# Patient Record
Sex: Male | Born: 1965 | Race: White | Hispanic: No | Smoking: Never smoker
Health system: Southern US, Community
[De-identification: ages and names within clinical notes are randomized; demographics above are authoritative.]

## PROBLEM LIST (undated history)

## (undated) DIAGNOSIS — I10 Essential (primary) hypertension: Secondary | ICD-10-CM

## (undated) DIAGNOSIS — E785 Hyperlipidemia, unspecified: Secondary | ICD-10-CM

## (undated) DIAGNOSIS — F32A Depression, unspecified: Secondary | ICD-10-CM

## (undated) HISTORY — PX: TOTAL HIP ARTHROPLASTY: SHX124

---

## 2010-12-10 ENCOUNTER — Emergency Department (INDEPENDENT_AMBULATORY_CARE_PROVIDER_SITE_OTHER): Payer: Medicare Other

## 2010-12-10 ENCOUNTER — Emergency Department (HOSPITAL_BASED_OUTPATIENT_CLINIC_OR_DEPARTMENT_OTHER)
Admission: EM | Admit: 2010-12-10 | Discharge: 2010-12-10 | Disposition: A | Discharge status: Home / Self Care | Payer: Medicare Other | Attending: Emergency Medicine | Admitting: Emergency Medicine

## 2010-12-10 DIAGNOSIS — I10 Essential (primary) hypertension: Secondary | ICD-10-CM | POA: Insufficient documentation

## 2010-12-10 DIAGNOSIS — R079 Chest pain, unspecified: Secondary | ICD-10-CM

## 2010-12-10 DIAGNOSIS — R071 Chest pain on breathing: Secondary | ICD-10-CM | POA: Insufficient documentation

## 2010-12-10 DIAGNOSIS — S2249XA Multiple fractures of ribs, unspecified side, initial encounter for closed fracture: Secondary | ICD-10-CM | POA: Insufficient documentation

## 2010-12-10 DIAGNOSIS — M25539 Pain in unspecified wrist: Secondary | ICD-10-CM

## 2010-12-10 DIAGNOSIS — E7800 Pure hypercholesterolemia, unspecified: Secondary | ICD-10-CM | POA: Insufficient documentation

## 2010-12-10 LAB — BASIC METABOLIC PANEL
BUN: 18 mg/dL (ref 6–23)
CO2: 19 mEq/L (ref 19–32)
Calcium: 9.6 mg/dL (ref 8.4–10.5)
Chloride: 109 mEq/L (ref 96–112)
Creatinine, Ser: 1.1 mg/dL (ref 0.4–1.5)
GFR calc Af Amer: >60 mL/min (ref >60)
GFR calc non Af Amer: >60 mL/min (ref >60)
Glucose, Bld: 119 mg/dL — ABNORMAL HIGH (ref 70–99)
Potassium: 3.5 mEq/L (ref 3.5–5.1)
Sodium: 144 mEq/L (ref 135–145)

## 2010-12-10 MED ORDER — IOHEXOL 300 MG/ML  SOLN
80.0000 mL | Freq: Once | INTRAMUSCULAR | Status: AC | PRN
  Administered 2010-12-10: 80 mL via INTRAVENOUS

## 2012-06-20 DIAGNOSIS — G4733 Obstructive sleep apnea (adult) (pediatric): Secondary | ICD-10-CM | POA: Insufficient documentation
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
## 2013-11-26 DIAGNOSIS — I10 Essential (primary) hypertension: Secondary | ICD-10-CM | POA: Diagnosis present

## 2013-11-26 DIAGNOSIS — D696 Thrombocytopenia, unspecified: Secondary | ICD-10-CM | POA: Insufficient documentation

## 2014-08-13 DIAGNOSIS — G4700 Insomnia, unspecified: Secondary | ICD-10-CM | POA: Insufficient documentation

## 2014-08-13 DIAGNOSIS — D860 Sarcoidosis of lung: Secondary | ICD-10-CM | POA: Insufficient documentation

## 2015-03-23 DIAGNOSIS — F32A Depression, unspecified: Secondary | ICD-10-CM | POA: Diagnosis present

## 2023-03-02 ENCOUNTER — Other Ambulatory Visit: Payer: Self-pay

## 2023-03-02 ENCOUNTER — Encounter (HOSPITAL_COMMUNITY): Payer: Self-pay

## 2023-03-02 ENCOUNTER — Inpatient Hospital Stay (HOSPITAL_COMMUNITY)
Admission: EM | Admit: 2023-03-02 | Discharge: 2023-03-05 | DRG: 871 | Disposition: A | Discharge status: Home / Self Care | Payer: Medicare Other | Attending: Student | Admitting: Student

## 2023-03-02 ENCOUNTER — Emergency Department (HOSPITAL_COMMUNITY): Payer: Medicare Other

## 2023-03-02 DIAGNOSIS — E785 Hyperlipidemia, unspecified: Secondary | ICD-10-CM | POA: Diagnosis present

## 2023-03-02 DIAGNOSIS — E876 Hypokalemia: Secondary | ICD-10-CM | POA: Diagnosis not present

## 2023-03-02 DIAGNOSIS — G4700 Insomnia, unspecified: Secondary | ICD-10-CM | POA: Diagnosis present

## 2023-03-02 DIAGNOSIS — F419 Anxiety disorder, unspecified: Secondary | ICD-10-CM | POA: Diagnosis present

## 2023-03-02 DIAGNOSIS — E871 Hypo-osmolality and hyponatremia: Secondary | ICD-10-CM | POA: Diagnosis present

## 2023-03-02 DIAGNOSIS — I2489 Other forms of acute ischemic heart disease: Secondary | ICD-10-CM | POA: Diagnosis present

## 2023-03-02 DIAGNOSIS — R7989 Other specified abnormal findings of blood chemistry: Secondary | ICD-10-CM | POA: Diagnosis not present

## 2023-03-02 DIAGNOSIS — J189 Pneumonia, unspecified organism: Secondary | ICD-10-CM | POA: Insufficient documentation

## 2023-03-02 DIAGNOSIS — N179 Acute kidney failure, unspecified: Secondary | ICD-10-CM | POA: Diagnosis present

## 2023-03-02 DIAGNOSIS — D860 Sarcoidosis of lung: Secondary | ICD-10-CM | POA: Diagnosis present

## 2023-03-02 DIAGNOSIS — Z6837 Body mass index (BMI) 37.0-37.9, adult: Secondary | ICD-10-CM | POA: Diagnosis not present

## 2023-03-02 DIAGNOSIS — G4733 Obstructive sleep apnea (adult) (pediatric): Secondary | ICD-10-CM | POA: Diagnosis present

## 2023-03-02 DIAGNOSIS — R509 Fever, unspecified: Principal | ICD-10-CM

## 2023-03-02 DIAGNOSIS — I10 Essential (primary) hypertension: Secondary | ICD-10-CM | POA: Diagnosis present

## 2023-03-02 DIAGNOSIS — T3695XA Adverse effect of unspecified systemic antibiotic, initial encounter: Secondary | ICD-10-CM | POA: Diagnosis present

## 2023-03-02 DIAGNOSIS — Z1152 Encounter for screening for COVID-19: Secondary | ICD-10-CM | POA: Diagnosis not present

## 2023-03-02 DIAGNOSIS — R6520 Severe sepsis without septic shock: Secondary | ICD-10-CM | POA: Diagnosis present

## 2023-03-02 DIAGNOSIS — R197 Diarrhea, unspecified: Secondary | ICD-10-CM | POA: Diagnosis present

## 2023-03-02 DIAGNOSIS — F32A Depression, unspecified: Secondary | ICD-10-CM | POA: Diagnosis present

## 2023-03-02 DIAGNOSIS — A403 Sepsis due to Streptococcus pneumoniae: Secondary | ICD-10-CM | POA: Diagnosis present

## 2023-03-02 DIAGNOSIS — D696 Thrombocytopenia, unspecified: Secondary | ICD-10-CM | POA: Diagnosis present

## 2023-03-02 DIAGNOSIS — A419 Sepsis, unspecified organism: Secondary | ICD-10-CM | POA: Diagnosis not present

## 2023-03-02 DIAGNOSIS — Z96642 Presence of left artificial hip joint: Secondary | ICD-10-CM | POA: Diagnosis present

## 2023-03-02 HISTORY — DX: Depression, unspecified: F32.A

## 2023-03-02 HISTORY — DX: Hyperlipidemia, unspecified: E78.5

## 2023-03-02 HISTORY — DX: Essential (primary) hypertension: I10

## 2023-03-02 LAB — COMPREHENSIVE METABOLIC PANEL
ALT: 46 U/L — ABNORMAL HIGH (ref 0–44)
AST: 38 U/L (ref 15–41)
Albumin: 3.4 g/dL — ABNORMAL LOW (ref 3.5–5.0)
Alkaline Phosphatase: 49 U/L (ref 38–126)
Anion gap: 9 (ref 5–15)
BUN: 16 mg/dL (ref 6–20)
CO2: 25 mmol/L (ref 22–32)
Calcium: 8.7 mg/dL — ABNORMAL LOW (ref 8.9–10.3)
Chloride: 100 mmol/L (ref 98–111)
Creatinine, Ser: 1.39 mg/dL — ABNORMAL HIGH (ref 0.61–1.24)
GFR, Estimated: 59 mL/min — ABNORMAL LOW (ref >60)
Glucose, Bld: 113 mg/dL — ABNORMAL HIGH (ref 70–99)
Potassium: 3.7 mmol/L (ref 3.5–5.1)
Sodium: 134 mmol/L — ABNORMAL LOW (ref 135–145)
Total Bilirubin: 0.7 mg/dL (ref 0.3–1.2)
Total Protein: 6.7 g/dL (ref 6.5–8.1)

## 2023-03-02 LAB — RESP PANEL BY RT-PCR (RSV, FLU A&B, COVID)  RVPGX2
Influenza A by PCR: NEGATIVE
Influenza B by PCR: NEGATIVE
Resp Syncytial Virus by PCR: NEGATIVE
SARS Coronavirus 2 by RT PCR: NEGATIVE

## 2023-03-02 LAB — CBC WITH DIFFERENTIAL/PLATELET
Abs Immature Granulocytes: 0.02 10*3/uL (ref 0.00–0.07)
Basophils Absolute: 0 10*3/uL (ref 0.0–0.1)
Basophils Relative: 0 %
Eosinophils Absolute: 0.1 10*3/uL (ref 0.0–0.5)
Eosinophils Relative: 1 %
HCT: 41.8 % (ref 39.0–52.0)
Hemoglobin: 13.6 g/dL (ref 13.0–17.0)
Immature Granulocytes: 0 %
Lymphocytes Relative: 10 %
Lymphs Abs: 0.8 10*3/uL (ref 0.7–4.0)
MCH: 30.4 pg (ref 26.0–34.0)
MCHC: 32.5 g/dL (ref 30.0–36.0)
MCV: 93.3 fL (ref 80.0–100.0)
Monocytes Absolute: 0.9 10*3/uL (ref 0.1–1.0)
Monocytes Relative: 12 %
Neutro Abs: 5.8 10*3/uL (ref 1.7–7.7)
Neutrophils Relative %: 77 %
Platelets: 131 10*3/uL — ABNORMAL LOW (ref 150–400)
RBC: 4.48 MIL/uL (ref 4.22–5.81)
RDW: 13.5 % (ref 11.5–15.5)
WBC: 7.6 10*3/uL (ref 4.0–10.5)
nRBC: 0 % (ref 0.0–0.2)

## 2023-03-02 LAB — RESPIRATORY PANEL BY PCR

## 2023-03-02 LAB — PROTIME-INR
INR: 1.2 (ref 0.8–1.2)
Prothrombin Time: 15 seconds (ref 11.4–15.2)

## 2023-03-02 LAB — URINALYSIS, W/ REFLEX TO CULTURE (INFECTION SUSPECTED)
Bilirubin Urine: NEGATIVE
Glucose, UA: NEGATIVE mg/dL
Ketones, ur: NEGATIVE mg/dL
Leukocytes,Ua: NEGATIVE
Nitrite: NEGATIVE
Protein, ur: 30 mg/dL — AB
Specific Gravity, Urine: 1.023 (ref 1.005–1.030)
pH: 5 (ref 5.0–8.0)

## 2023-03-02 LAB — TROPONIN I (HIGH SENSITIVITY)
Troponin I (High Sensitivity): 22 ng/L — ABNORMAL HIGH (ref <18)
Troponin I (High Sensitivity): 23 ng/L — ABNORMAL HIGH (ref <18)

## 2023-03-02 LAB — BRAIN NATRIURETIC PEPTIDE: B Natriuretic Peptide: 43.2 pg/mL (ref 0.0–100.0)

## 2023-03-02 LAB — LACTIC ACID, PLASMA
Lactic Acid, Venous: 1.6 mmol/L (ref 0.5–1.9)
Lactic Acid, Venous: 1.1 mmol/L (ref 0.5–1.9)

## 2023-03-02 LAB — CK: Total CK: 123 U/L (ref 49–397)

## 2023-03-02 LAB — APTT: aPTT: 34 seconds (ref 24–36)

## 2023-03-02 MED ORDER — ONDANSETRON HCL 4 MG/2ML IJ SOLN: 4.0000 mg | Freq: Four times a day (QID) | INTRAMUSCULAR | Status: DC | PRN

## 2023-03-02 MED ORDER — ENOXAPARIN SODIUM 40 MG/0.4ML IJ SOSY
40.0000 mg | PREFILLED_SYRINGE | INTRAMUSCULAR | Status: DC
  Administered 2023-03-02 – 2023-03-04 (×3): 40 mg via SUBCUTANEOUS
  Filled 2023-03-02 (×3): qty 0.4

## 2023-03-02 MED ORDER — SODIUM CHLORIDE 0.9 % IV SOLN: 2.0000 g | Freq: Once | INTRAVENOUS | Status: DC

## 2023-03-02 MED ORDER — GUAIFENESIN ER 600 MG PO TB12
600.0000 mg | ORAL_TABLET | Freq: Two times a day (BID) | ORAL | Status: DC
  Administered 2023-03-03 – 2023-03-05 (×5): 600 mg via ORAL
  Filled 2023-03-02 (×6): qty 1

## 2023-03-02 MED ORDER — SODIUM CHLORIDE 0.9% FLUSH
3.0000 mL | Freq: Two times a day (BID) | INTRAVENOUS | Status: DC
  Administered 2023-03-02 – 2023-03-05 (×6): 3 mL via INTRAVENOUS

## 2023-03-02 MED ORDER — ACETAMINOPHEN 650 MG RE SUPP: 650.0000 mg | Freq: Four times a day (QID) | RECTAL | Status: DC | PRN

## 2023-03-02 MED ORDER — SODIUM CHLORIDE 0.9 % IV SOLN: 100.0000 mg | Freq: Once | INTRAVENOUS | Status: DC

## 2023-03-02 MED ORDER — MELATONIN 3 MG PO TABS
3.0000 mg | ORAL_TABLET | Freq: Every evening | ORAL | Status: DC | PRN
  Administered 2023-03-02: 3 mg via ORAL
  Filled 2023-03-02: qty 1

## 2023-03-02 MED ORDER — CITALOPRAM HYDROBROMIDE 40 MG PO TABS
40.0000 mg | ORAL_TABLET | Freq: Every day | ORAL | Status: DC
  Administered 2023-03-03 – 2023-03-05 (×3): 40 mg via ORAL
  Filled 2023-03-02 (×3): qty 1

## 2023-03-02 MED ORDER — SODIUM CHLORIDE 0.9 % IV SOLN
2.0000 g | INTRAVENOUS | Status: DC
  Administered 2023-03-03 – 2023-03-05 (×3): 2 g via INTRAVENOUS
  Filled 2023-03-02 (×3): qty 20

## 2023-03-02 MED ORDER — SODIUM CHLORIDE 0.9 % IV SOLN
500.0000 mg | INTRAVENOUS | Status: DC
  Administered 2023-03-02: 500 mg via INTRAVENOUS
  Filled 2023-03-02 (×2): qty 5

## 2023-03-02 MED ORDER — SODIUM CHLORIDE 0.9 % IV SOLN
1.0000 g | Freq: Once | INTRAVENOUS | Status: AC
  Administered 2023-03-02: 1 g via INTRAVENOUS
  Filled 2023-03-02: qty 10

## 2023-03-02 MED ORDER — SODIUM CHLORIDE 0.9 % IV BOLUS
1000.0000 mL | Freq: Once | INTRAVENOUS | Status: AC
  Administered 2023-03-02: 1000 mL via INTRAVENOUS

## 2023-03-02 MED ORDER — LACTATED RINGERS IV BOLUS
1000.0000 mL | Freq: Once | INTRAVENOUS | Status: AC
  Administered 2023-03-02: 1000 mL via INTRAVENOUS

## 2023-03-02 MED ORDER — ONDANSETRON HCL 4 MG PO TABS
4.0000 mg | ORAL_TABLET | Freq: Four times a day (QID) | ORAL | Status: DC | PRN
  Filled 2023-03-02: qty 1

## 2023-03-02 MED ORDER — SIMVASTATIN 20 MG PO TABS
40.0000 mg | ORAL_TABLET | Freq: Every evening | ORAL | Status: DC
  Administered 2023-03-02 – 2023-03-04 (×3): 40 mg via ORAL
  Filled 2023-03-02 (×3): qty 2

## 2023-03-02 MED ORDER — ACETAMINOPHEN 325 MG PO TABS
650.0000 mg | ORAL_TABLET | Freq: Once | ORAL | Status: AC
  Administered 2023-03-02: 650 mg via ORAL
  Filled 2023-03-02: qty 2

## 2023-03-02 MED ORDER — ACETAMINOPHEN 325 MG PO TABS
650.0000 mg | ORAL_TABLET | Freq: Four times a day (QID) | ORAL | Status: DC | PRN
  Administered 2023-03-02 – 2023-03-04 (×5): 650 mg via ORAL
  Filled 2023-03-02 (×5): qty 2

## 2023-03-02 MED ORDER — ALBUTEROL SULFATE (2.5 MG/3ML) 0.083% IN NEBU: 2.5000 mg | INHALATION_SOLUTION | RESPIRATORY_TRACT | Status: DC | PRN

## 2023-03-02 MED ORDER — LACTATED RINGERS IV SOLN: INTRAVENOUS | Status: DC

## 2023-03-02 MED ORDER — SENNOSIDES-DOCUSATE SODIUM 8.6-50 MG PO TABS: 1.0000 | ORAL_TABLET | Freq: Every evening | ORAL | Status: DC | PRN

## 2023-03-02 MED ORDER — IOHEXOL 350 MG/ML SOLN
75.0000 mL | Freq: Once | INTRAVENOUS | Status: AC | PRN
  Administered 2023-03-02: 75 mL via INTRAVENOUS

## 2023-03-02 NOTE — ED Triage Notes (Signed)
Pt came in via POV d/t shivers, diaphoresis & not sleeping well since he reports mowing the grass for a couple hours 5 days ago. He also had a few occasions of uncontrolled loose stools & started a fever yesterday & today in triage he is 103.2. A/Ox4, & reports 4/10 body aches felt in his shoulders.

## 2023-03-02 NOTE — H&P (Signed)
History and Physical    Louis Casey ZOX:[id number] DOB: [birth year] DOA: 03/02/2023  PCP: Adolph Pollack, FNP  Patient coming from: Home  I have personally briefly reviewed patient's old medical records in [facility]  Chief Complaint: Fever, shortness of breath  HPI: Louis Casey is a 57 y.o. male with medical history significant for HTN, HLD, depression who presented to the ED for evaluation of fever and shortness of breath.  Patient reports 5 days of  Fevers, chills, body aches, nonproductive cough, and progressive shortness of breath.  He has had some small-volume loose stools.  Appetite has been low.  He has not had any nausea, vomiting, abdominal pain.  He denies any tobacco use or sick contacts.  He went to urgent care today where he had negative influenza A/B testing.  He was sent to the ED for further evaluation.  ED Course  Labs/Imaging on admission: I have personally reviewed following labs and imaging studies.  Initial vitals showed BP 163/71, pulse 90, RR 28, temp 103.2 F, SpO2 95% on room air.  Labs show WBC 7.6, hemoglobin 13.6, platelets 131,000, sodium 134, potassium 3.7, bicarb 25, BUN 16, creatinine 1.39, serum glucose 113, AST 30, ALT 46, alk phos 49, total Aramine 0.7, CK1 23, troponin 22, lactic acid 1.6 > 1.1.  COVID, influenza, RSV PCR negative.  Blood cultures in process.  UA negative for UTI.  CT chest/abdomen/pelvis with contrast shows multifocal patchy airspace opacities in the bilateral lower lobes and minimally in the left upper lobe.  No acute localizing process in the abdomen or pelvis.  Moderate sized ventral hernia containing mesenteric fat noted.  SpO2 dropped to 90% with ambulation and patient appeared dyspneic.  Patient was given 1 L LR, 1 L normal saline, IV ceftriaxone and doxycycline.  The hospitalist service was consulted to admit for further evaluation and management.  Review of Systems: All systems reviewed and are negative except  as documented in history of present illness above.   Past Medical History:  Diagnosis Date   Depression    Hyperlipemia    Hypertension     Past Surgical History:  Procedure Laterality Date   TOTAL HIP ARTHROPLASTY Left     Social History:  reports that he has never smoked. He has never used smokeless tobacco. He reports that he does not drink alcohol and does not use drugs.  No Known Allergies  History reviewed. No pertinent family history.   Prior to Admission medications   Medication Sig Start Date End Date Taking? Authorizing Provider  acetaminophen (TYLENOL) 500 MG tablet Take 1,000 mg by mouth every 6 (six) hours as needed for moderate pain, fever or headache.   Yes [provider]  citalopram (CELEXA) 40 MG tablet Take 40 mg by mouth daily.   Yes [provider]  losartan (COZAAR) 100 MG tablet Take 100 mg by mouth every evening.   Yes [provider]  MELATONIN PO Take 3-6 tablets by mouth at bedtime as needed (sleep).   Yes [provider]  simvastatin (ZOCOR) 40 MG tablet Take 40 mg by mouth every evening.   Yes [provider]  triamcinolone cream (KENALOG) 0.1 % Apply 1 Application topically 2 (two) times daily as needed (rash, irritation).   Yes [provider]    Physical Exam: Vitals:   03/02/23 1600 03/02/23 1604 03/02/23 1725 03/02/23 1726  BP: (!) 147/82   (!) 151/89  Pulse: 79   81  Resp: (!) 27  18  Temp:  99.8 F (37.7 C) 99.8 F (37.7 C)   TempSrc:  Oral Oral   SpO2: 97%   98%  Weight:      Height:       Constitutional: Resting in bed, fatigued Eyes: EOMI, lids and conjunctivae normal ENMT: Mucous membranes are moist. Posterior pharynx clear of any exudate or lesions.Normal dentition.  Neck: normal, supple, no masses. Respiratory: Bibasilar inspiratory crackles. Normal respiratory effort while at rest. No accessory muscle use.  Frequent cough. Cardiovascular: Regular rate and rhythm, no  murmurs / rubs / gallops. No extremity edema. 2+ pedal pulses. Abdomen: no tenderness, no masses palpated.  Musculoskeletal: no clubbing / cyanosis. No joint deformity upper and lower extremities. Good ROM, no contractures. Normal muscle tone.  Skin: no rashes, lesions, ulcers. No induration Neurologic: Sensation intact. Strength 5/5 in all 4.  Psychiatric: Normal judgment and insight. Alert and oriented x 3. Normal mood.   EKG: Personally reviewed. Sinus rhythm, rate 90, no acute ischemic changes.  No prior for comparison.  Assessment/Plan Principal Problem:   Sepsis due to pneumonia [facility] Active Problems:   AKI (acute kidney injury) (HCC)   Elevated troponin   Hypertension   Hyperlipemia   Depression   Louis Casey is a 57 y.o. male with medical history significant for HTN, HLD, depression who is admitted with sepsis due to multifocal pneumonia.  Assessment and Plan: Sepsis due to multifocal pneumonia, POA: Presenting with fever, tachycardia.  CT imaging consistent with multifocal pneumonia.  COVID, flu, RSV negative.  SpO2 stable at rest, dropped to 90% with ambulation. -Continue IV ceftriaxone and azithromycin -Follow blood cultures, strep pneumonia urinary antigen -Supplemental O2 as needed -Check full respiratory pathogen panel -IS, FV, Mucinex  Acute kidney injury: Mild with creatinine 1.39 on admission compared to previous baseline 1.07 in the setting of sepsis.  Hold losartan and continue IV fluid hydration overnight.  Elevated troponin: Mild troponin elevation of 22.  Patient denies any chest pain.  Likely demand ischemia in setting of sepsis.  Repeat troponin pending.  EKG without acute ischemic changes.  Hypertension: Losartan on hold due to AKI.  Hyperlipidemia: Continue simvastatin.  Depression: Continue Celexa.   DVT prophylaxis: enoxaparin (LOVENOX) injection 40 mg Start: 03/02/23 1945 Code Status: Full code Family Communication: Discussed with patient,  he has discussed with family Disposition Plan: From home and likely discharge to home pending clinical progress Consults called: None Severity of Illness: The appropriate patient status for this patient is INPATIENT. Inpatient status is judged to be reasonable and necessary in order to provide the required intensity of service to ensure the patient's safety. The patient's presenting symptoms, physical exam findings, and initial radiographic and laboratory data in the context of their chronic comorbidities is felt to place them at high risk for further clinical deterioration. Furthermore, it is not anticipated that the patient will be medically stable for discharge from the hospital within 2 midnights of admission.   * I certify that at the point of admission it is my clinical judgment that the patient will require inpatient hospital care spanning beyond 2 midnights from the point of admission due to high intensity of service, high risk for further deterioration and high frequency of surveillance required.Darreld Mclean MD [facility]  If 7PM-7AM, please contact night-coverage [email or web address]  03/02/2023, 7:41 PM

## 2023-03-02 NOTE — ED Notes (Signed)
ED TO INPATIENT HANDOFF REPORT  ED Nurse Name and Phone #: Amil Amen [phone]  S Name/Age/Gender Louis Casey 57 y.o. male Room/Bed: 020C/020C  Code Status   Code Status: Full Code  Home/SNF/Other Home Patient oriented to: self, place, time, and situation Is this baseline? Yes   Triage Complete: Triage complete  Chief Complaint Sepsis due to pneumonia (HCC) [J18.9, A41.9]  Triage Note Pt came in via POV d/t shivers, diaphoresis & not sleeping well since he reports mowing the grass for a couple hours 5 days ago. He also had a few occasions of uncontrolled loose stools & started a fever yesterday & today in triage he is 103.2. A/Ox4, & reports 4/10 body aches felt in his shoulders.    Allergies No Known Allergies  Level of Care/Admitting Diagnosis ED Disposition     ED Disposition  Admit   Condition  --   Comment  Hospital Area: [facility] [[id number]]  Level of Care: Telemetry Medical [104]  May admit patient to [facility] or [facility] if equivalent level of care is available:: No  Covid Evaluation: Confirmed COVID Negative  Diagnosis: Sepsis due to pneumonia [facility]) [[id number]]  Admitting Physician: Charlsie Quest [[id number]]  Attending Physician: Charlsie Quest [[id number]]  Certification:: I certify this patient will need inpatient services for at least 2 midnights  Estimated Length of Stay: 2          B Medical/Surgery History Past Medical History:  Diagnosis Date   Depression    Hyperlipemia    Hypertension    Past Surgical History:  Procedure Laterality Date   TOTAL HIP ARTHROPLASTY Left      A IV Location/Drains/Wounds Patient Lines/Drains/Airways Status     Active Line/Drains/Airways     Name Placement date Placement time Site Days   Peripheral IV 03/02/23 20 G Anterior;Distal;Right;Upper Arm 03/02/23  1345  Arm  less than 1            Intake/Output Last 24 hours  Intake/Output Summary (Last 24 hours) at 03/02/2023  2155 Last data filed at 03/02/2023 1711 Gross per 24 hour  Intake 2105.09 ml  Output --  Net 2105.09 ml    Labs/Imaging Results for orders placed or performed during the hospital encounter of 03/02/23 (from the past 48 hour(s))  Lactic acid, plasma     Status: None   Collection Time: 03/02/23 12:41 PM  Result Value Ref Range   Lactic Acid, Venous 1.6 0.5 - 1.9 mmol/L    Comment: Performed at [facility], [address], [place], [place] [address]  Comprehensive metabolic panel     Status: Abnormal   Collection Time: 03/02/23 12:41 PM  Result Value Ref Range   Sodium 134 (L) 135 - 145 mmol/L   Potassium 3.7 3.5 - 5.1 mmol/L   Chloride 100 98 - 111 mmol/L   CO2 25 22 - 32 mmol/L   Glucose, Bld 113 (H) 70 - 99 mg/dL    Comment: Glucose reference range applies only to samples taken after fasting for at least 8 hours.   BUN 16 6 - 20 mg/dL   Creatinine, Ser 4.69 (H) 0.61 - 1.24 mg/dL   Calcium 8.7 (L) 8.9 - 10.3 mg/dL   Total Protein 6.7 6.5 - 8.1 g/dL   Albumin 3.4 (L) 3.5 - 5.0 g/dL   AST 38 15 - 41 U/L   ALT 46 (H) 0 - 44 U/L   Alkaline Phosphatase 49 38 - 126 U/L  Total Bilirubin 0.7 0.3 - 1.2 mg/dL   GFR, Estimated 59 (L) >60 mL/min    Comment: (NOTE) Calculated using the CKD-EPI Creatinine Equation (2021)    Anion gap 9 5 - 15    Comment: Performed at [facility], [address], [place], [place] [address]  CBC with Differential     Status: Abnormal   Collection Time: 03/02/23 12:41 PM  Result Value Ref Range   WBC 7.6 4.0 - 10.5 K/uL   RBC 4.48 4.22 - 5.81 MIL/uL   Hemoglobin 13.6 13.0 - 17.0 g/dL   HCT 60.4 54.0 - 98.1 %   MCV 93.3 80.0 - 100.0 fL   MCH 30.4 26.0 - 34.0 pg   MCHC 32.5 30.0 - 36.0 g/dL   RDW 19.1 47.8 - 29.5 %   Platelets 131 (L) 150 - 400 K/uL   nRBC 0.0 0.0 - 0.2 %   Neutrophils Relative % 77 %   Neutro Abs 5.8 1.7 - 7.7 K/uL   Lymphocytes Relative 10 %   Lymphs Abs 0.8 0.7 - 4.0 K/uL   Monocytes Relative 12 %   Monocytes  Absolute 0.9 0.1 - 1.0 K/uL   Eosinophils Relative 1 %   Eosinophils Absolute 0.1 0.0 - 0.5 K/uL   Basophils Relative 0 %   Basophils Absolute 0.0 0.0 - 0.1 K/uL   Immature Granulocytes 0 %   Abs Immature Granulocytes 0.02 0.00 - 0.07 K/uL    Comment: Performed at [facility], [address], [place], [place] [address]  CK     Status: None   Collection Time: 03/02/23 12:41 PM  Result Value Ref Range   Total CK 123 49 - 397 U/L    Comment: Performed at [facility], [address], [place], [place] [address]  Resp panel by RT-PCR (RSV, Flu A&B, Covid) Anterior Nasal Swab     Status: None   Collection Time: 03/02/23 12:50 PM   Specimen: Anterior Nasal Swab  Result Value Ref Range   SARS Coronavirus 2 by RT PCR NEGATIVE NEGATIVE   Influenza A by PCR NEGATIVE NEGATIVE   Influenza B by PCR NEGATIVE NEGATIVE    Comment: (NOTE) The Xpert Xpress SARS-CoV-2/FLU/RSV plus assay is intended as an aid in the diagnosis of influenza from Nasopharyngeal swab specimens and should not be used as a sole basis for treatment. Nasal washings and aspirates are unacceptable for Xpert Xpress SARS-CoV-2/FLU/RSV testing.  Fact Sheet for Patients: BloggerCourse.com  Fact Sheet for Healthcare Providers: SeriousBroker.it  This test is not yet approved or cleared by the [place] FDA and has been authorized for detection and/or diagnosis of SARS-CoV-2 by FDA under an Emergency Use Authorization (EUA). This EUA will remain in effect (meaning this test can be used) for the duration of the COVID-19 declaration under Section 564(b)(1) of the Act, 21 U.S.C. section 360bbb-3(b)(1), unless the authorization is terminated or revoked.     Resp Syncytial Virus by PCR NEGATIVE NEGATIVE    Comment: (NOTE) Fact Sheet for Patients: BloggerCourse.com  Fact Sheet for Healthcare  Providers: SeriousBroker.it  This test is not yet approved or cleared by the [place] FDA and has been authorized for detection and/or diagnosis of SARS-CoV-2 by FDA under an Emergency Use Authorization (EUA). This EUA will remain in effect (meaning this test can be used) for the duration of the COVID-19 declaration under Section 564(b)(1) of the Act, 21 U.S.C. section 360bbb-3(b)(1), unless the authorization is terminated or revoked.  Performed at [facility], [address] [place], [place] [address]   Urinalysis, w/ Reflex to Culture (Infection Suspected) -Urine, Clean Catch     Status: Abnormal   Collection Time: 03/02/23  3:15 PM  Result Value Ref Range   Specimen Source URINE, CLEAN CATCH    Color, Urine YELLOW YELLOW   APPearance CLEAR CLEAR   Specific Gravity, Urine 1.023 1.005 - 1.030   pH 5.0 5.0 - 8.0   Glucose, UA NEGATIVE NEGATIVE mg/dL   Hgb urine dipstick SMALL (A) NEGATIVE   Bilirubin Urine NEGATIVE NEGATIVE   Ketones, ur NEGATIVE NEGATIVE mg/dL   Protein, ur 30 (A) NEGATIVE mg/dL   Nitrite NEGATIVE NEGATIVE   Leukocytes,Ua NEGATIVE NEGATIVE   RBC / HPF 0-5 0 - 5 RBC/hpf   WBC, UA 0-5 0 - 5 WBC/hpf    Comment:        Reflex urine culture not performed if WBC <=10, OR if Squamous epithelial cells >5. If Squamous epithelial cells >5 suggest recollection.    Bacteria, UA RARE (A) NONE SEEN   Squamous Epithelial / HPF 0-5 0 - 5 /HPF   Mucus PRESENT     Comment: Performed at [facility], [address] [place], [place] [address]  Lactic acid, plasma     Status: None   Collection Time: 03/02/23  5:20 PM  Result Value Ref Range   Lactic Acid, Venous 1.1 0.5 - 1.9 mmol/L    Comment: Performed at [facility], [address], [place], [place] [address]  Protime-INR     Status: None   Collection Time: 03/02/23  5:20 PM  Result Value Ref Range   Prothrombin Time 15.0 11.4 - 15.2 seconds   INR 1.2 0.8 - 1.2     Comment: (NOTE) INR goal varies based on device and disease states. Performed at [facility], [address], [place], [place] [address]   APTT     Status: None   Collection Time: 03/02/23  5:20 PM  Result Value Ref Range   aPTT 34 24 - 36 seconds    Comment: Performed at [facility], [address], [place], [place] [address]  Troponin I (High Sensitivity)     Status: Abnormal   Collection Time: 03/02/23  5:20 PM  Result Value Ref Range   Troponin I (High Sensitivity) 22 (H) <18 ng/L    Comment: (NOTE) Elevated high sensitivity troponin I (hsTnI) values and significant  changes across serial measurements may suggest ACS but many other  chronic and acute conditions are known to elevate hsTnI results.  Refer to the "Links" section for chest pain algorithms and additional  guidance. Performed at [facility], [address], [place], [place] [address]   Respiratory (~20 pathogens) panel by PCR     Status: None   Collection Time: 03/02/23  7:23 PM   Specimen: Nasopharyngeal Swab; Respiratory  Result Value Ref Range   Adenovirus NOT DETECTED NOT DETECTED   Coronavirus 229E NOT DETECTED NOT DETECTED    Comment: (NOTE) The Coronavirus on the Respiratory Panel, DOES NOT test for the novel  Coronavirus (2019 nCoV)    Coronavirus HKU1 NOT DETECTED NOT DETECTED   Coronavirus NL63 NOT DETECTED NOT DETECTED   Coronavirus OC43 NOT DETECTED NOT DETECTED   Metapneumovirus NOT DETECTED NOT DETECTED   Rhinovirus / Enterovirus NOT DETECTED NOT DETECTED   Influenza A NOT DETECTED NOT DETECTED   Influenza B NOT  DETECTED NOT DETECTED   Parainfluenza Virus 1 NOT DETECTED NOT DETECTED   Parainfluenza Virus 2 NOT DETECTED NOT DETECTED   Parainfluenza Virus 3 NOT DETECTED NOT DETECTED   Parainfluenza Virus 4 NOT DETECTED NOT DETECTED   Respiratory Syncytial Virus NOT DETECTED NOT DETECTED   Bordetella pertussis NOT DETECTED NOT DETECTED   Bordetella Parapertussis NOT DETECTED  NOT DETECTED   Chlamydophila pneumoniae NOT DETECTED NOT DETECTED   Mycoplasma pneumoniae NOT DETECTED NOT DETECTED    Comment: Performed at [facility], [address], [place], [place] [address]  Troponin I (High Sensitivity)     Status: Abnormal   Collection Time: 03/02/23  7:46 PM  Result Value Ref Range   Troponin I (High Sensitivity) 23 (H) <18 ng/L    Comment: (NOTE) Elevated high sensitivity troponin I (hsTnI) values and significant  changes across serial measurements may suggest ACS but many other  chronic and acute conditions are known to elevate hsTnI results.  Refer to the "Links" section for chest pain algorithms and additional  guidance. Performed at [facility], [address] [place], [place] [address]   Brain natriuretic peptide     Status: None   Collection Time: 03/02/23  7:46 PM  Result Value Ref Range   B Natriuretic Peptide 43.2 0.0 - 100.0 pg/mL    Comment: Performed at [facility], [address] [place], [place] [address]   CT CHEST ABDOMEN PELVIS W CONTRAST  Result Date: 03/02/2023 CLINICAL DATA:  Sepsis.  Abdominal pain with diarrhea. EXAM: CT CHEST, ABDOMEN, AND PELVIS WITH CONTRAST TECHNIQUE: Multidetector CT imaging of the chest, abdomen and pelvis was performed following the standard protocol during bolus administration of intravenous contrast. RADIATION DOSE REDUCTION: This exam was performed according to the departmental dose-optimization program which includes automated exposure control, adjustment of the mA and/or kV according to patient size and/or use of iterative reconstruction technique. CONTRAST:  75mL OMNIPAQUE IOHEXOL 350 MG/ML SOLN COMPARISON:  CT of the chest 12/10/2010 FINDINGS: CT CHEST FINDINGS Cardiovascular: No significant vascular findings. Normal heart size. No pericardial effusion. Mediastinum/Nodes: No enlarged mediastinal, hilar, or axillary lymph nodes. Thyroid gland, trachea, and esophagus demonstrate no  significant findings. There is a small hiatal hernia. Lungs/Pleura: Multifocal patchy airspace opacities are seen in the bilateral lower lobes and minimally in the left upper lobe. There is no pleural effusion or pneumothorax. Trachea and central airways are patent. Musculoskeletal: No acute fractures are seen. There are healed left second, third and fourth rib fractures. CT ABDOMEN PELVIS FINDINGS Hepatobiliary: No focal liver abnormality is seen. Status post cholecystectomy. No biliary dilatation. Pancreas: Unremarkable. No pancreatic ductal dilatation or surrounding inflammatory changes. Spleen: Normal in size without focal abnormality. Adrenals/Urinary Tract: Adrenal glands are unremarkable. Kidneys are normal, without renal calculi, focal lesion, or hydronephrosis. Bladder is unremarkable. Stomach/Bowel: Stomach is within normal limits. Appendix appears normal. No evidence of bowel wall thickening, distention, or inflammatory changes. Vascular/Lymphatic: No significant vascular findings are present. No enlarged abdominal or pelvic lymph nodes. Reproductive: Prostate is unremarkable. Other: There is a anterior upper abdominal wall ventral hernia containing mesenteric fat, left of midline, moderate in size. Musculoskeletal: No acute osseous findings. Left-sided hip screw is present. There severe degenerative changes of the left hip. IMPRESSION: 1. Multifocal patchy airspace opacities in the bilateral lower lobes and minimally in the left upper lobe worrisome for multi focal pneumonia. 2. No acute localizing process in the abdomen or pelvis. 3. Moderate-sized ventral hernia containing mesenteric  fat. Electronically Signed   By: Darliss Cheney M.D.   On: 03/02/2023 17:36   DG Chest 2 View  Result Date: 03/02/2023 CLINICAL DATA:  Fever. EXAM: CHEST - 2 VIEW COMPARISON:  None Available. FINDINGS: The heart size and mediastinal contours are within normal limits. Bilateral perihilar opacities are noted concerning for  edema or atypical inflammation. The visualized skeletal structures are unremarkable. IMPRESSION: Bilateral perihilar opacities are noted concerning for edema or atypical inflammation. Follow-up radiographs are recommended. Electronically Signed   By: Lupita Raider M.D.   On: 03/02/2023 13:51    Pending Labs Unresulted Labs (From admission, onward)     Start     Ordered   03/03/23 0500  HIV Antibody (routine testing w rflx)  (HIV Antibody (Routine testing w reflex) panel)  Tomorrow morning,   R        03/02/23 1935   03/03/23 0500  Procalcitonin  Tomorrow morning,   R       References:    Procalcitonin Lower Respiratory Tract Infection AND Sepsis Procalcitonin Algorithm   03/02/23 1935   03/03/23 0500  CBC  Tomorrow morning,   R        03/02/23 1935   03/03/23 0500  Basic metabolic panel  Tomorrow morning,   R        03/02/23 1935   03/02/23 1935  Strep pneumoniae urinary antigen  Add-on,   AD        03/02/23 1935   03/02/23 1256  Blood Culture (routine x 2)  (Undifferentiated presentation (screening labs and basic nursing orders))  BLOOD CULTURE X 2,   STAT      03/02/23 1256            Vitals/Pain Today's Vitals   03/02/23 1941 03/02/23 2000 03/02/23 2020 03/02/23 2153  BP: (!) 160/77 (!) 178/83  (!) 168/75  Pulse: 93 98  90  Resp: 18 20  16   Temp: (!) 103.1 F (39.5 C)   99.2 F (37.3 C)  TempSrc: Oral   Oral  SpO2: 98% 95%  95%  Weight:      Height:      PainSc: 0-No pain  0-No pain 0-No pain    Isolation Precautions Droplet precaution  Medications Medications  enoxaparin (LOVENOX) injection 40 mg (40 mg Subcutaneous Given 03/02/23 1952)  sodium chloride flush (NS) 0.9 % injection 3 mL (3 mLs Intravenous Given 03/02/23 2132)  cefTRIAXone (ROCEPHIN) 2 g in sodium chloride 0.9 % 100 mL IVPB (has no administration in time range)  azithromycin (ZITHROMAX) 500 mg in sodium chloride 0.9 % 250 mL IVPB (0 mg Intravenous Stopped 03/02/23 2131)  acetaminophen (TYLENOL) tablet  650 mg (650 mg Oral Given 03/02/23 1944)    Or  acetaminophen (TYLENOL) suppository 650 mg ( Rectal See Alternative 03/02/23 1944)  ondansetron (ZOFRAN) tablet 4 mg (has no administration in time range)    Or  ondansetron (ZOFRAN) injection 4 mg (has no administration in time range)  senna-docusate (Senokot-S) tablet 1 tablet (has no administration in time range)  guaiFENesin (MUCINEX) 12 hr tablet 600 mg (600 mg Oral Not Given 03/02/23 2132)  albuterol (PROVENTIL) (2.5 MG/3ML) 0.083% nebulizer solution 2.5 mg (has no administration in time range)  lactated ringers infusion ( Intravenous New Bag/Given 03/02/23 1958)  citalopram (CELEXA) tablet 40 mg (has no administration in time range)  melatonin tablet 3 mg (has no administration in time range)  simvastatin (ZOCOR) tablet 40 mg (40 mg Oral Given 03/02/23 2132)  acetaminophen (TYLENOL) tablet 650 mg (650 mg Oral Given 03/02/23 1410)  cefTRIAXone (ROCEPHIN) 1 g in sodium chloride 0.9 % 100 mL IVPB (0 g Intravenous Stopped 03/02/23 1554)  sodium chloride 0.9 % bolus 1,000 mL (0 mLs Intravenous Stopped 03/02/23 1554)  lactated ringers bolus 1,000 mL (0 mLs Intravenous Stopped 03/02/23 1711)  iohexol (OMNIPAQUE) 350 MG/ML injection 75 mL (75 mLs Intravenous Contrast Given 03/02/23 1649)    Mobility walks     Focused Assessments Pulmonary Assessment Handoff:  Lung sounds: Bilateral Breath Sounds: Clear L Breath Sounds: Clear R Breath Sounds: Clear O2 Device: Room Air      R Recommendations: See Admitting Provider Note  Report given to:   Additional Notes: A+O x 4, uses urinal,

## 2023-03-02 NOTE — Plan of Care (Signed)
  Problem: Fluid Volume: Goal: Hemodynamic stability will improve Outcome: Progressing   Problem: Education: Goal: Knowledge of General Education information will improve Description: Including pain rating scale, medication(s)/side effects and non-pharmacologic comfort measures Outcome: Progressing   

## 2023-03-02 NOTE — ED Notes (Signed)
Ambulated pt with portable pulse ox. Pt dropped down to 90% and was dyspneic. Pt states he is not usually this short of breath. MD made aware.

## 2023-03-02 NOTE — ED Provider Notes (Signed)
Medical Decision Making: Care of patient assumed from [place], PA-C  at 3:49 PM.  Agree with history.  See their note for further details.  Briefly, 57 y.o. male with PMH/PSH as below.  Past Medical History:  Diagnosis Date   Depression    Hyperlipemia    Hypertension    Past Surgical History:  Procedure Laterality Date   TOTAL HIP ARTHROPLASTY Left       Patient presents with fever without a known source.  Is having some chest tightness and has haziness on chest x-ray.  No known tick exposure.  No headaches/neck stiffness low concern for meningitis.  Currently pending labs and CT chest abdomen and pelvis.  Current plan is as follows: Follow-up workup and reassess  MDM:  -Reviewed and confirmed nursing documentation for past medical history, family history, social history. -Vital signs stable. -Upon reevaluation, patient resting comfortably in bed, hemodynamically stable, no distress. -CT chest abdomen pelvis is notable for pneumonia.  I have ambulated the patient and during ambulation patient desaturates from 95% to 89 to 90% on room air.  He is severely dyspneic and has difficulty walking due to his shortness of breath.  Given this we will proceed with IV antibiotics and admission.  Labs otherwise my interpretation show mild creatinine bump from baseline 1.0-1.39 today.  Mild hyponatremia and thrombocytopenia.  CK is normal.  COVID and flu negative.  UA without UTI.  Lactic acid normal.  Troponin is mildly elevated to 22 with repeat 23.  Delta troponin of 1.  EKG on my interpretation without STEMI or ischemic equivalent.  Low suspicion for ACS at this time.  I discussed the patient with medicine who will admit the patient. -Patient had no acute events while under my care in the emergency department.     The plan for this patient was discussed with Dr. Wilkie Aye, who voiced agreement and who oversaw evaluation and treatment of this patient.  Marta Lamas, MD Emergency  Medicine, PGY-3  Note: Dragon medical dictation software was used in the creation of this note.      Chase Caller, MD 03/02/23 2119    Rozelle Logan, DO 03/02/23 2338

## 2023-03-02 NOTE — ED Notes (Signed)
Pt reports that he neglected to mention that he has had a headache for 5 days. He attributes this to insomnia.

## 2023-03-02 NOTE — ED Notes (Signed)
Lupita Leash is pt's wife [phone]. Would like to be updated with plan.

## 2023-03-02 NOTE — Hospital Course (Signed)
Louis Casey is a 57 y.o. male with medical history significant for HTN, HLD, depression who is admitted with sepsis due to multifocal pneumonia.

## 2023-03-02 NOTE — ED Provider Triage Note (Signed)
Emergency Medicine Provider Triage Evaluation Note  Louis Casey , a 57 y.o. male  was evaluated in triage.  Pt complains of feeling sick, chills onset Saturday after mowing the ground, laid in bed. Woke up and went to dinner. Has been in bed with sweats for the past few days. Home COVID negative. Yesterday tried gummies to try and help with sleep since he was having trouble sleeping (desperate, doesn't normally take gummies, took 1). Went to UC today who checked a urine and told there was something with his urine and digestive system and told to go to the ER.  Fever 101 at home, taking zinc, Advil, immune vitamin.  Review of Systems  Positive: SHOB, worse with exertion, fever, urine dark orange (since Saturday, decreased volume), occasional cough, diarrhea, nausea Negative: Vomiting, abdominal pain  Physical Exam  BP (!) 163/71 (BP Location: Right Arm)   Pulse 90   Temp (!) 103.2 F (39.6 C)   Resp (!) 28   Ht 6\' 1"  (1.854 m)   Wt 126.6 kg   SpO2 95%   BMI 36.81 kg/m  Gen:   Awake, no distress   Resp:  Tachypenic,. Right lower lung crackles  MSK:   Moves extremities without difficulty  Other:  Abd soft and non tender   Medical Decision Making  Medically screening exam initiated at 12:47 PM.  Appropriate orders placed.  Louis Casey was informed that the remainder of the evaluation will be completed by another provider, this initial triage assessment does not replace that evaluation, and the importance of remaining in the ED until their evaluation is complete.     Louis Fend, PA-C 03/02/23 1255

## 2023-03-02 NOTE — ED Provider Notes (Signed)
[place] EMERGENCY DEPARTMENT AT [facility] Provider Note   CSN: [id number] Arrival date & time: 03/02/23  1201     History  Chief Complaint  Patient presents with   Fever   Generalized Body Aches   Loose Stools    Louis Casey is a 57 y.o. male.  Patient presents emergency department today for evaluation of febrile illness.  No significant past medical history.  No chronic medications.  Patient states that he mowed his yard on 02/25/2023.  Upon coming back inside he noted that he was feeling very tired and generally unwell.  He reports multiple vague symptoms including some chest discomfort without significant cough or shortness of breath.  He has had diarrhea that has been watery without blood.  No significant abdominal pain or back pain.  He does report urinary frequency at baseline.  No rectal pain but states that he has been having "trouble" urinating at times noting that his urine is dark in color.  He has spent most of his time over the past several days sleeping.  Patient's family member administered a home COVID test which was negative.  He has tried over-the-counter medications including THC gummies which he got from a neighbor.  He did go to an urgent care today and had some testing done was told that he may have something wrong with his urine or in his abdomen and was referred to the ED.  He cannot give further details.  No known sick contacts.  No recent travels.  No tick bites.  Tmax at home was 101 F.       Home Medications Prior to Admission medications   Not on File      Allergies    Patient has no allergy information on record.    Review of Systems   Review of Systems  Physical Exam Updated Vital Signs BP (!) 176/84   Pulse 88   Temp (!) 103.2 F (39.6 C)   Resp (!) 23   Ht 6\' 1"  (1.854 m)   Wt 126.6 kg   SpO2 100%   BMI 36.81 kg/m   Physical Exam Vitals and nursing note reviewed.  Constitutional:      General: He is not in acute  distress.    Appearance: He is well-developed.  HENT:     Head: Normocephalic and atraumatic.     Right Ear: Tympanic membrane, ear canal and external ear normal.     Left Ear: Tympanic membrane, ear canal and external ear normal.     Nose: Nose normal.     Mouth/Throat:     Mouth: Mucous membranes are moist.  Eyes:     General:        Right eye: No discharge.        Left eye: No discharge.     Conjunctiva/sclera: Conjunctivae normal.  Cardiovascular:     Rate and Rhythm: Normal rate and regular rhythm.     Heart sounds: Normal heart sounds.  Pulmonary:     Effort: Pulmonary effort is normal.     Breath sounds: Normal breath sounds.  Abdominal:     Palpations: Abdomen is soft.     Tenderness: There is no abdominal tenderness. There is no right CVA tenderness, left CVA tenderness, guarding or rebound.  Musculoskeletal:     Cervical back: Normal range of motion and neck supple.     Right lower leg: No edema.     Left lower leg: No edema.  Skin:  General: Skin is warm and dry.  Neurological:     Mental Status: He is alert.     ED Results / Procedures / Treatments   Labs (all labs ordered are listed, but only abnormal results are displayed) Labs Reviewed  COMPREHENSIVE METABOLIC PANEL - Abnormal; Notable for the following components:      Result Value   Sodium 134 (*)    Glucose, Bld 113 (*)    Creatinine, Ser 1.39 (*)    Calcium 8.7 (*)    Albumin 3.4 (*)    ALT 46 (*)    GFR, Estimated 59 (*)    All other components within normal limits  CBC WITH DIFFERENTIAL/PLATELET - Abnormal; Notable for the following components:   Platelets 131 (*)    All other components within normal limits  RESP PANEL BY RT-PCR (RSV, FLU A&B, COVID)  RVPGX2  CULTURE, BLOOD (ROUTINE X 2)  CULTURE, BLOOD (ROUTINE X 2)  LACTIC ACID, PLASMA  CK  LACTIC ACID, PLASMA  PROTIME-INR  APTT  URINALYSIS, W/ REFLEX TO CULTURE (INFECTION SUSPECTED)  TROPONIN I (HIGH SENSITIVITY)  TROPONIN I  (HIGH SENSITIVITY)    EKG EKG Interpretation  Date/Time:  Thursday March 02 2023 12:59:24 EDT Ventricular Rate:  90 PR Interval:  136 QRS Duration: 84 QT Interval:  362 QTC Calculation: 442 R Axis:   -39 Text Interpretation: Normal sinus rhythm Left axis deviation Abnormal ECG No old tracing to compare Confirmed by Melene Plan [phone]) on 03/02/2023 1:37:33 PM  Radiology DG Chest 2 View  Result Date: 03/02/2023 CLINICAL DATA:  Fever. EXAM: CHEST - 2 VIEW COMPARISON:  None Available. FINDINGS: The heart size and mediastinal contours are within normal limits. Bilateral perihilar opacities are noted concerning for edema or atypical inflammation. The visualized skeletal structures are unremarkable. IMPRESSION: Bilateral perihilar opacities are noted concerning for edema or atypical inflammation. Follow-up radiographs are recommended. Electronically Signed   By: Lupita Raider M.D.   On: 03/02/2023 13:51    Procedures Procedures    Medications Ordered in ED Medications  lactated ringers bolus 1,000 mL (has no administration in time range)  acetaminophen (TYLENOL) tablet 650 mg (650 mg Oral Given 03/02/23 1410)  cefTRIAXone (ROCEPHIN) 1 g in sodium chloride 0.9 % 100 mL IVPB (1 g Intravenous New Bag/Given 03/02/23 1443)  sodium chloride 0.9 % bolus 1,000 mL (1,000 mLs Intravenous New Bag/Given 03/02/23 1413)    ED Course/ Medical Decision Making/ A&P    Patient seen and examined. History obtained directly from patient.   Labs/EKG: Sepsis workup was ordered in triage including APTT and PT/INR to evaluate for signs of endorgan damage related to sepsis.  I have added troponin due to patient's vague chest symptoms.  EKG personally reviewed and interpreted as above.  Imaging: Ordered chest x-ray with questionable infiltrates..  Medications/Fluids: Ordered: IV fluid bolus.   Most recent vital signs reviewed and are as follows: BP (!) 176/84   Pulse 88   Temp (!) 103.2 F (39.6 C)   Resp (!)  23   Ht 6\' 1"  (1.854 m)   Wt 126.6 kg   SpO2 100%   BMI 36.81 kg/m   Initial impression: Febrile illness, unclear cause.  Patient's chest x-ray does appear to be potentially abnormal.  Also considered UTI source such as prostatitis or urinary tract infection.  3:37 PM Signout to ED resident Dr. Ericka Pontiff at shift change.   Reassessment performed. Patient appears stable.   Labs personally reviewed and interpreted including: CBC with  normal white blood cell count and hemoglobin, slightly low platelets at 131; CMP with creatinine of 1.39, BUN of 16, otherwise unremarkable; lactate normal at 1.6; CK normal at 23.  Imaging personally visualized and interpreted including: Chest x-ray, agree hazy perihilar opacities noted.  Reviewed pertinent lab work and imaging with patient at bedside. Questions answered.   Most current vital signs reviewed and are as follows: BP (!) 151/37   Pulse 92   Temp (!) 103.2 F (39.6 C)   Resp (!) 30   Ht 6\' 1"  (1.854 m)   Wt 126.6 kg   SpO2 97%   BMI 36.81 kg/m   Plan: Will obtain imaging of the chest, abdomen and pelvis given vague symptoms to further evaluate for possibility pneumonia, intra-abdominal infection.  Awaiting UA as well.  Patient looks well, nontoxic overall.  No respiratory distress although mild tachypnea noted.                                Medical Decision Making Amount and/or Complexity of Data Reviewed Labs: ordered. Radiology: ordered.   Pending completion of work-up.         Final Clinical Impression(s) / ED Diagnoses Final diagnoses:  Febrile illness    Rx / DC Orders ED Discharge Orders     None         Renne Crigler, PA-C 03/02/23 1540    Melene Plan, DO 03/02/23 1540

## 2023-03-02 NOTE — ED Notes (Signed)
Pt got SOB while ambulating across the hall to his room after using the bathroom. 97% on RA when back on the monitor.

## 2023-03-03 DIAGNOSIS — F419 Anxiety disorder, unspecified: Secondary | ICD-10-CM | POA: Diagnosis not present

## 2023-03-03 DIAGNOSIS — A419 Sepsis, unspecified organism: Secondary | ICD-10-CM | POA: Diagnosis not present

## 2023-03-03 DIAGNOSIS — F32A Depression, unspecified: Secondary | ICD-10-CM

## 2023-03-03 DIAGNOSIS — I10 Essential (primary) hypertension: Secondary | ICD-10-CM

## 2023-03-03 DIAGNOSIS — R7989 Other specified abnormal findings of blood chemistry: Secondary | ICD-10-CM | POA: Diagnosis not present

## 2023-03-03 DIAGNOSIS — N179 Acute kidney failure, unspecified: Secondary | ICD-10-CM

## 2023-03-03 DIAGNOSIS — J189 Pneumonia, unspecified organism: Secondary | ICD-10-CM

## 2023-03-03 DIAGNOSIS — R6520 Severe sepsis without septic shock: Secondary | ICD-10-CM

## 2023-03-03 LAB — CBC
HCT: 38.3 % — ABNORMAL LOW (ref 39.0–52.0)
Hemoglobin: 13 g/dL (ref 13.0–17.0)
MCH: 31.3 pg (ref 26.0–34.0)
MCHC: 33.9 g/dL (ref 30.0–36.0)
MCV: 92.3 fL (ref 80.0–100.0)
Platelets: 113 10*3/uL — ABNORMAL LOW (ref 150–400)
RBC: 4.15 MIL/uL — ABNORMAL LOW (ref 4.22–5.81)
RDW: 13.7 % (ref 11.5–15.5)
WBC: 7.5 10*3/uL (ref 4.0–10.5)
nRBC: 0 % (ref 0.0–0.2)

## 2023-03-03 LAB — BASIC METABOLIC PANEL
Anion gap: 10 (ref 5–15)
BUN: 11 mg/dL (ref 6–20)
CO2: 22 mmol/L (ref 22–32)
Calcium: 8.3 mg/dL — ABNORMAL LOW (ref 8.9–10.3)
Chloride: 102 mmol/L (ref 98–111)
Creatinine, Ser: 1 mg/dL (ref 0.61–1.24)
GFR, Estimated: >60 mL/min (ref >60)
Glucose, Bld: 156 mg/dL — ABNORMAL HIGH (ref 70–99)
Potassium: 2.9 mmol/L — ABNORMAL LOW (ref 3.5–5.1)
Sodium: 134 mmol/L — ABNORMAL LOW (ref 135–145)

## 2023-03-03 LAB — MAGNESIUM: Magnesium: 2 mg/dL (ref 1.7–2.4)

## 2023-03-03 LAB — STREP PNEUMONIAE URINARY ANTIGEN: Strep Pneumo Urinary Antigen: NEGATIVE

## 2023-03-03 LAB — HIV ANTIBODY (ROUTINE TESTING W REFLEX): HIV Screen 4th Generation wRfx: NONREACTIVE

## 2023-03-03 LAB — HEMOGLOBIN A1C
Hgb A1c MFr Bld: 5.3 % (ref 4.8–5.6)
Mean Plasma Glucose: 105.41 mg/dL

## 2023-03-03 LAB — PROCALCITONIN: Procalcitonin: 0.19 ng/mL

## 2023-03-03 MED ORDER — AZITHROMYCIN 250 MG PO TABS
500.0000 mg | ORAL_TABLET | ORAL | Status: DC
  Administered 2023-03-03 – 2023-03-04 (×2): 500 mg via ORAL
  Filled 2023-03-03 (×2): qty 2

## 2023-03-03 MED ORDER — ZOLPIDEM TARTRATE 5 MG PO TABS: 5.0000 mg | ORAL_TABLET | Freq: Every evening | ORAL | Status: DC | PRN

## 2023-03-03 MED ORDER — MELATONIN 3 MG PO TABS: 6.0000 mg | ORAL_TABLET | Freq: Every evening | ORAL | Status: DC | PRN

## 2023-03-03 MED ORDER — LOSARTAN POTASSIUM 50 MG PO TABS
100.0000 mg | ORAL_TABLET | Freq: Every evening | ORAL | Status: DC
  Administered 2023-03-03 – 2023-03-04 (×2): 100 mg via ORAL
  Filled 2023-03-03 (×2): qty 2

## 2023-03-03 MED ORDER — POTASSIUM CHLORIDE CRYS ER 20 MEQ PO TBCR
40.0000 meq | EXTENDED_RELEASE_TABLET | ORAL | Status: AC
  Administered 2023-03-03 (×3): 40 meq via ORAL
  Filled 2023-03-03 (×3): qty 2

## 2023-03-03 MED ORDER — ZOLPIDEM TARTRATE 5 MG PO TABS
10.0000 mg | ORAL_TABLET | Freq: Every evening | ORAL | Status: DC | PRN
  Administered 2023-03-03 – 2023-03-04 (×2): 10 mg via ORAL
  Filled 2023-03-03 (×2): qty 2

## 2023-03-03 NOTE — Progress Notes (Signed)
PROGRESS NOTE  Louis Casey WUJ:[id number] DOB: 01-05-1966   PCP: Adolph Pollack, FNP  Patient is from: Home.  Lives with his wife.  Independently ambulates at baseline.  DOA: 03/02/2023 LOS: 1  Chief complaints Chief Complaint  Patient presents with   Fever   Generalized Body Aches   Loose Stools     Brief Narrative / Interim history: 57 year old M with PMH of HTN, HLD, depression, OSA and obesity presenting with fever, chills, myalgia, dry cough and progressive shortness of breath for 5 days and found to have multifocal pneumonia with CTA chest showing bilateral lower lobe and LUL patchy infiltrate, and admitted for severe sepsis due to multifocal pneumonia.  He was febrile 203.2 and tachypneic to upper 20s.  Also AKI with Cr 1.4.  COVID-19, influenza and full RVP nonreactive.  Blood cultures obtained.  Started on IV fluid, CTX and Zithromax, and admitted.    Subjective: Seen and examined earlier this morning.  No major events overnight of this morning.  Shortness of breath improved but still with significant DOE, fatigue and intermittent dry cough.  Some headache with cough.  Also lack of sleep despite melatonin.  Objective: Vitals:   03/02/23 2153 03/02/23 2328 03/03/23 0441 03/03/23 0810  BP: (!) 168/75 (!) 163/86 (!) 165/87 (!) 155/76  Pulse: 90 82 87 83  Resp: 16 18 18  (!) 21  Temp: 99.2 F (37.3 C) 99.5 F (37.5 C) 98.7 F (37.1 C) 99.8 F (37.7 C)  TempSrc: Oral Oral Oral Oral  SpO2: 95% 97% 92% 95%  Weight:  127.8 kg    Height:        Examination:  GENERAL: No apparent distress.  Nontoxic. HEENT: MMM.  Vision and hearing grossly intact.  NECK: Supple.  No apparent JVD.  RESP:  No IWOB.  Fair aeration bilaterally. CVS:  RRR. Heart sounds normal.  ABD/GI/GU: BS+. Abd soft, NTND.  MSK/EXT:  Moves extremities. No apparent deformity. No edema.  SKIN: no apparent skin lesion or wound NEURO: Awake, alert and oriented appropriately.  No apparent focal neuro  deficit. PSYCH: Calm. Normal affect.   Procedures:  None  Microbiology summarized: COVID-19, influenza and RSV PCR nonreactive Full RVP nonreactive  Assessment and plan: Principal Problem:   Severe sepsis (HCC) Active Problems:   AKI (acute kidney injury) (HCC)   Elevated troponin   Hypertension, benign   Hyperlipemia   Anxiety and depression   Multifocal pneumonia   Morbid obesity (HCC)  Severe sepsis due to multifocal pneumonia: POA.  Febrile and tachypneic with AKI on presentation.  Symptomatic for 5 days.  No leukocytosis.  COVID-19, influenza, RSV and full RVP panel nonreactive.  Blood cultures pending.  No oxygen requirement. -Continue CTX and Zithromax -Continue IV fluid for AKI -Continue changes from atria, flutter valve and antitussive  Acute kidney injury: Baseline Cr about 1.0.  AKI resolved. Recent Labs    03/02/23 1241 03/03/23 0828  BUN 16 11  CREATININE 1.39* 1.00  -Continue IV fluid hydration   Elevated troponin: Likely demand ischemia.  No significant delta to suggest ACS.  No chest pain either.  EKG without acute ischemic finding.   Hypertension: BP slightly elevated. -Resume home losartan   Hyperlipidemia: -Continue simvastatin.   Anxiety/depression/insomnia: -Continue Celexa. -Increase melatonin -Add Ambien  OSA on CPAP -CPAP at night  Hypokalemia -Monitor replenish as appropriate  Thrombocytopenia: Mild.  Chronic. -Continue monitoring  Morbid obesity: Elevated BMI with comorbidity as above. Body mass index is 37.17 kg/m. -Encourage lifestyle change to  lose weight           DVT prophylaxis:  enoxaparin (LOVENOX) injection 40 mg Start: 03/02/23 1945  Code Status: Full code Family Communication: None at bedside Level of care: Med-Surg Status is: Inpatient Remains inpatient appropriate because: Severe sepsis due to multifocal pneumonia and hypokalemia   Final disposition: Home Consultants:  None  55 minutes with more  than 50% spent in reviewing records, counseling patient/family and coordinating care.   Sch Meds:  Scheduled Meds:  citalopram  40 mg Oral Daily   enoxaparin (LOVENOX) injection  40 mg Subcutaneous Q24H   guaiFENesin  600 mg Oral BID   losartan  100 mg Oral QPM   potassium chloride  40 mEq Oral Q4H   simvastatin  40 mg Oral QPM   sodium chloride flush  3 mL Intravenous Q12H   Continuous Infusions:  azithromycin Stopped (03/02/23 2056)   cefTRIAXone (ROCEPHIN)  IV 2 g (03/03/23 1014)   PRN Meds:.acetaminophen **OR** acetaminophen, albuterol, melatonin, ondansetron **OR** ondansetron (ZOFRAN) IV, senna-docusate, zolpidem  Antimicrobials: Anti-infectives (From admission, onward)    Start     Dose/Rate Route Frequency Ordered Stop   03/03/23 1000  cefTRIAXone (ROCEPHIN) 2 g in sodium chloride 0.9 % 100 mL IVPB        2 g 200 mL/hr over 30 Minutes Intravenous Every 24 hours 03/02/23 1935 03/08/23 0959   03/02/23 1945  azithromycin (ZITHROMAX) 500 mg in sodium chloride 0.9 % 250 mL IVPB        500 mg 250 mL/hr over 60 Minutes Intravenous Every 24 hours 03/02/23 1935 03/07/23 1944   03/02/23 1845  cefTRIAXone (ROCEPHIN) 2 g in sodium chloride 0.9 % 100 mL IVPB  Status:  Discontinued        2 g 200 mL/hr over 30 Minutes Intravenous  Once 03/02/23 1839 03/02/23 1935   03/02/23 1845  doxycycline (VIBRAMYCIN) 100 mg in sodium chloride 0.9 % 250 mL IVPB  Status:  Discontinued        100 mg 125 mL/hr over 120 Minutes Intravenous  Once 03/02/23 1839 03/02/23 1935   03/02/23 1300  cefTRIAXone (ROCEPHIN) 1 g in sodium chloride 0.9 % 100 mL IVPB        1 g 200 mL/hr over 30 Minutes Intravenous  Once 03/02/23 1256 03/02/23 1554        I have personally reviewed the following labs and images: CBC: Recent Labs  Lab 03/02/23 1241 03/03/23 0828  WBC 7.6 7.5  NEUTROABS 5.8  --   HGB 13.6 13.0  HCT 41.8 38.3*  MCV 93.3 92.3  PLT 131* 113*   BMP &GFR Recent Labs  Lab 03/02/23 1241  03/03/23 0828  NA 134* 134*  K 3.7 2.9*  CL 100 102  CO2 25 22  GLUCOSE 113* 156*  BUN 16 11  CREATININE 1.39* 1.00  CALCIUM 8.7* 8.3*   Estimated Creatinine Clearance: 112.9 mL/min (by C-G formula based on SCr of 1 mg/dL). Liver & Pancreas: Recent Labs  Lab 03/02/23 1241  AST 38  ALT 46*  ALKPHOS 49  BILITOT 0.7  PROT 6.7  ALBUMIN 3.4*   No results for input(s): "LIPASE", "AMYLASE" in the last 168 hours. No results for input(s): "AMMONIA" in the last 168 hours. Diabetic: No results for input(s): "HGBA1C" in the last 72 hours. No results for input(s): "GLUCAP" in the last 168 hours. Cardiac Enzymes: Recent Labs  Lab 03/02/23 1241  CKTOTAL 123   No results for input(s): "PROBNP" in the  last 8760 hours. Coagulation Profile: Recent Labs  Lab 03/02/23 1720  INR 1.2   Thyroid Function Tests: No results for input(s): "TSH", "T4TOTAL", "FREET4", "T3FREE", "THYROIDAB" in the last 72 hours. Lipid Profile: No results for input(s): "CHOL", "HDL", "LDLCALC", "TRIG", "CHOLHDL", "LDLDIRECT" in the last 72 hours. Anemia Panel: No results for input(s): "VITAMINB12", "FOLATE", "FERRITIN", "TIBC", "IRON", "RETICCTPCT" in the last 72 hours. Urine analysis:    Component Value Date/Time   COLORURINE YELLOW 03/02/2023 1515   APPEARANCEUR CLEAR 03/02/2023 1515   LABSPEC 1.023 03/02/2023 1515   PHURINE 5.0 03/02/2023 1515   GLUCOSEU NEGATIVE 03/02/2023 1515   HGBUR SMALL (A) 03/02/2023 1515   BILIRUBINUR NEGATIVE 03/02/2023 1515   KETONESUR NEGATIVE 03/02/2023 1515   PROTEINUR 30 (A) 03/02/2023 1515   NITRITE NEGATIVE 03/02/2023 1515   LEUKOCYTESUR NEGATIVE 03/02/2023 1515   Sepsis Labs: Invalid input(s): "PROCALCITONIN", "LACTICIDVEN"  Microbiology: Recent Results (from the past 240 hour(s))  Resp panel by RT-PCR (RSV, Flu A&B, Covid) Anterior Nasal Swab     Status: None   Collection Time: 03/02/23 12:50 PM   Specimen: Anterior Nasal Swab  Result Value Ref Range Status    SARS Coronavirus 2 by RT PCR NEGATIVE NEGATIVE Final   Influenza A by PCR NEGATIVE NEGATIVE Final   Influenza B by PCR NEGATIVE NEGATIVE Final    Comment: (NOTE) The Xpert Xpress SARS-CoV-2/FLU/RSV plus assay is intended as an aid in the diagnosis of influenza from Nasopharyngeal swab specimens and should not be used as a sole basis for treatment. Nasal washings and aspirates are unacceptable for Xpert Xpress SARS-CoV-2/FLU/RSV testing.  Fact Sheet for Patients: BloggerCourse.com  Fact Sheet for Healthcare Providers: SeriousBroker.it  This test is not yet approved or cleared by the [place] FDA and has been authorized for detection and/or diagnosis of SARS-CoV-2 by FDA under an Emergency Use Authorization (EUA). This EUA will remain in effect (meaning this test can be used) for the duration of the COVID-19 declaration under Section 564(b)(1) of the Act, 21 U.S.C. section 360bbb-3(b)(1), unless the authorization is terminated or revoked.     Resp Syncytial Virus by PCR NEGATIVE NEGATIVE Final    Comment: (NOTE) Fact Sheet for Patients: BloggerCourse.com  Fact Sheet for Healthcare Providers: SeriousBroker.it  This test is not yet approved or cleared by the [place] FDA and has been authorized for detection and/or diagnosis of SARS-CoV-2 by FDA under an Emergency Use Authorization (EUA). This EUA will remain in effect (meaning this test can be used) for the duration of the COVID-19 declaration under Section 564(b)(1) of the Act, 21 U.S.C. section 360bbb-3(b)(1), unless the authorization is terminated or revoked.  Performed at [facility], [address] [place], [place] [address]   Respiratory (~20 pathogens) panel by PCR     Status: None   Collection Time: 03/02/23  7:23 PM   Specimen: Nasopharyngeal Swab; Respiratory  Result Value Ref Range Status    Adenovirus NOT DETECTED NOT DETECTED Final   Coronavirus 229E NOT DETECTED NOT DETECTED Final    Comment: (NOTE) The Coronavirus on the Respiratory Panel, DOES NOT test for the novel  Coronavirus (2019 nCoV)    Coronavirus HKU1 NOT DETECTED NOT DETECTED Final   Coronavirus NL63 NOT DETECTED NOT DETECTED Final   Coronavirus OC43 NOT DETECTED NOT DETECTED Final   Metapneumovirus NOT DETECTED NOT DETECTED Final   Rhinovirus / Enterovirus NOT DETECTED NOT DETECTED Final   Influenza A NOT DETECTED NOT DETECTED Final   Influenza B NOT DETECTED  NOT DETECTED Final   Parainfluenza Virus 1 NOT DETECTED NOT DETECTED Final   Parainfluenza Virus 2 NOT DETECTED NOT DETECTED Final   Parainfluenza Virus 3 NOT DETECTED NOT DETECTED Final   Parainfluenza Virus 4 NOT DETECTED NOT DETECTED Final   Respiratory Syncytial Virus NOT DETECTED NOT DETECTED Final   Bordetella pertussis NOT DETECTED NOT DETECTED Final   Bordetella Parapertussis NOT DETECTED NOT DETECTED Final   Chlamydophila pneumoniae NOT DETECTED NOT DETECTED Final   Mycoplasma pneumoniae NOT DETECTED NOT DETECTED Final    Comment: Performed at [facility], [address], [place], [place] [address]    Radiology Studies: CT CHEST ABDOMEN PELVIS W CONTRAST  Result Date: 03/02/2023 CLINICAL DATA:  Sepsis.  Abdominal pain with diarrhea. EXAM: CT CHEST, ABDOMEN, AND PELVIS WITH CONTRAST TECHNIQUE: Multidetector CT imaging of the chest, abdomen and pelvis was performed following the standard protocol during bolus administration of intravenous contrast. RADIATION DOSE REDUCTION: This exam was performed according to the departmental dose-optimization program which includes automated exposure control, adjustment of the mA and/or kV according to patient size and/or use of iterative reconstruction technique. CONTRAST:  75mL OMNIPAQUE IOHEXOL 350 MG/ML SOLN COMPARISON:  CT of the chest 12/10/2010 FINDINGS: CT CHEST FINDINGS Cardiovascular: No  significant vascular findings. Normal heart size. No pericardial effusion. Mediastinum/Nodes: No enlarged mediastinal, hilar, or axillary lymph nodes. Thyroid gland, trachea, and esophagus demonstrate no significant findings. There is a small hiatal hernia. Lungs/Pleura: Multifocal patchy airspace opacities are seen in the bilateral lower lobes and minimally in the left upper lobe. There is no pleural effusion or pneumothorax. Trachea and central airways are patent. Musculoskeletal: No acute fractures are seen. There are healed left second, third and fourth rib fractures. CT ABDOMEN PELVIS FINDINGS Hepatobiliary: No focal liver abnormality is seen. Status post cholecystectomy. No biliary dilatation. Pancreas: Unremarkable. No pancreatic ductal dilatation or surrounding inflammatory changes. Spleen: Normal in size without focal abnormality. Adrenals/Urinary Tract: Adrenal glands are unremarkable. Kidneys are normal, without renal calculi, focal lesion, or hydronephrosis. Bladder is unremarkable. Stomach/Bowel: Stomach is within normal limits. Appendix appears normal. No evidence of bowel wall thickening, distention, or inflammatory changes. Vascular/Lymphatic: No significant vascular findings are present. No enlarged abdominal or pelvic lymph nodes. Reproductive: Prostate is unremarkable. Other: There is a anterior upper abdominal wall ventral hernia containing mesenteric fat, left of midline, moderate in size. Musculoskeletal: No acute osseous findings. Left-sided hip screw is present. There severe degenerative changes of the left hip. IMPRESSION: 1. Multifocal patchy airspace opacities in the bilateral lower lobes and minimally in the left upper lobe worrisome for multi focal pneumonia. 2. No acute localizing process in the abdomen or pelvis. 3. Moderate-sized ventral hernia containing mesenteric fat. Electronically Signed   By: Darliss Cheney M.D.   On: 03/02/2023 17:36   DG Chest 2 View  Result Date:  03/02/2023 CLINICAL DATA:  Fever. EXAM: CHEST - 2 VIEW COMPARISON:  None Available. FINDINGS: The heart size and mediastinal contours are within normal limits. Bilateral perihilar opacities are noted concerning for edema or atypical inflammation. The visualized skeletal structures are unremarkable. IMPRESSION: Bilateral perihilar opacities are noted concerning for edema or atypical inflammation. Follow-up radiographs are recommended. Electronically Signed   By: Lupita Raider M.D.   On: 03/02/2023 13:51      Ferol Laiche T. Cyril Woodmansee [facility]  If 7PM-7AM, please contact night-coverage [email or web address] 03/03/2023, 11:46 AM

## 2023-03-03 NOTE — Progress Notes (Signed)
   03/03/23 1705  Assess: MEWS Score  Temp (!) 101.6 F (38.7 C)  BP (!) 165/92  MAP (mmHg) 113  Pulse Rate 82  SpO2 98 %  O2 Device Room Air  Assess: MEWS Score  MEWS Temp 2  MEWS Systolic 0  MEWS Pulse 0  MEWS RR 0  MEWS LOC 0  MEWS Score 2  MEWS Score Color Yellow  Assess: if the MEWS score is Yellow or Red  Were vital signs taken at a resting state? Yes  Focused Assessment No change from prior assessment  Does the patient meet 2 or more of the SIRS criteria? No  MEWS guidelines implemented  Yes, yellow  Treat  MEWS Interventions Considered administering scheduled or prn medications/treatments as ordered  Take Vital Signs  Increase Vital Sign Frequency  Yellow: Q2hr x1, continue Q4hrs until patient remains green for 12hrs  Escalate  MEWS: Escalate Yellow: Discuss with charge nurse and consider notifying provider and/or RRT  Notify: Charge Nurse/RN  Name of Charge Nurse/RN Notified Raquel RN  Assess: SIRS CRITERIA  SIRS Temperature  1  SIRS Pulse 0  SIRS Respirations  0  SIRS WBC 0  SIRS Score Sum  1

## 2023-03-03 NOTE — Plan of Care (Signed)

## 2023-03-04 DIAGNOSIS — A419 Sepsis, unspecified organism: Secondary | ICD-10-CM | POA: Diagnosis not present

## 2023-03-04 DIAGNOSIS — N179 Acute kidney failure, unspecified: Secondary | ICD-10-CM | POA: Diagnosis not present

## 2023-03-04 DIAGNOSIS — F419 Anxiety disorder, unspecified: Secondary | ICD-10-CM | POA: Diagnosis not present

## 2023-03-04 DIAGNOSIS — R7989 Other specified abnormal findings of blood chemistry: Secondary | ICD-10-CM | POA: Diagnosis not present

## 2023-03-04 LAB — CULTURE, BLOOD (ROUTINE X 2)

## 2023-03-04 LAB — RENAL FUNCTION PANEL
Albumin: 2.9 g/dL — ABNORMAL LOW (ref 3.5–5.0)
Anion gap: 12 (ref 5–15)
BUN: 9 mg/dL (ref 6–20)
CO2: 21 mmol/L — ABNORMAL LOW (ref 22–32)
Calcium: 8.8 mg/dL — ABNORMAL LOW (ref 8.9–10.3)
Chloride: 105 mmol/L (ref 98–111)
Creatinine, Ser: 0.92 mg/dL (ref 0.61–1.24)
GFR, Estimated: >60 mL/min (ref >60)
Glucose, Bld: 109 mg/dL — ABNORMAL HIGH (ref 70–99)
Phosphorus: 2.8 mg/dL (ref 2.5–4.6)
Potassium: 4.3 mmol/L (ref 3.5–5.1)
Sodium: 138 mmol/L (ref 135–145)

## 2023-03-04 LAB — CBC
HCT: 39.6 % (ref 39.0–52.0)
Hemoglobin: 13.8 g/dL (ref 13.0–17.0)
MCH: 31.7 pg (ref 26.0–34.0)
MCHC: 34.8 g/dL (ref 30.0–36.0)
MCV: 91 fL (ref 80.0–100.0)
Platelets: 96 10*3/uL — ABNORMAL LOW (ref 150–400)
RBC: 4.35 MIL/uL (ref 4.22–5.81)
RDW: 13.4 % (ref 11.5–15.5)
WBC: 6.4 10*3/uL (ref 4.0–10.5)
nRBC: 0 % (ref 0.0–0.2)

## 2023-03-04 LAB — MAGNESIUM: Magnesium: 2.1 mg/dL (ref 1.7–2.4)

## 2023-03-04 LAB — PROCALCITONIN: Procalcitonin: 0.18 ng/mL

## 2023-03-04 MED ORDER — KETOROLAC TROMETHAMINE 15 MG/ML IJ SOLN
15.0000 mg | Freq: Once | INTRAMUSCULAR | Status: AC
  Administered 2023-03-04: 15 mg via INTRAVENOUS
  Filled 2023-03-04: qty 1

## 2023-03-04 MED ORDER — BUTALBITAL-APAP-CAFFEINE 50-325-40 MG PO TABS
1.0000 | ORAL_TABLET | Freq: Four times a day (QID) | ORAL | Status: DC | PRN
  Administered 2023-03-04: 1 via ORAL
  Filled 2023-03-04: qty 1

## 2023-03-04 MED ORDER — DEXAMETHASONE SODIUM PHOSPHATE 10 MG/ML IJ SOLN
10.0000 mg | Freq: Once | INTRAMUSCULAR | Status: AC
  Administered 2023-03-04: 10 mg via INTRAVENOUS
  Filled 2023-03-04: qty 1

## 2023-03-04 MED ORDER — LABETALOL HCL 5 MG/ML IV SOLN
10.0000 mg | INTRAVENOUS | Status: DC | PRN
  Administered 2023-03-04: 10 mg via INTRAVENOUS
  Filled 2023-03-04: qty 4

## 2023-03-04 MED ORDER — LOPERAMIDE HCL 2 MG PO CAPS: 2.0000 mg | ORAL_CAPSULE | ORAL | Status: DC | PRN

## 2023-03-04 NOTE — Progress Notes (Signed)
Patient 02 Saturations while sitting 95% on room air.   Patient ambulated one lap around the hallway maintaining an 02 Saturation of 94-95% on room air.  Patient tolerated ambulating well and denies any shortness of breath or lightheadedness

## 2023-03-04 NOTE — Progress Notes (Signed)
PROGRESS NOTE  Louis Casey WGN:[id number] DOB: 28-Mar-1966   PCP: Adolph Pollack, FNP  Patient is from: Home.  Lives with his wife.  Independently ambulates at baseline.  DOA: 03/02/2023 LOS: 2  Chief complaints Chief Complaint  Patient presents with   Fever   Generalized Body Aches   Loose Stools     Brief Narrative / Interim history: 57 year old M with PMH of HTN, HLD, depression, OSA and obesity presenting with fever, chills, myalgia, dry cough and progressive shortness of breath for 5 days and found to have multifocal pneumonia with CTA chest showing bilateral lower lobe and LUL patchy infiltrate, and admitted for severe sepsis due to multifocal pneumonia.  He was febrile 203.2 and tachypneic to upper 20s.  Also AKI with Cr 1.4.  COVID-19, influenza and full RVP nonreactive.  Blood cultures obtained.  Started on IV fluid, CTX and Zithromax, and admitted.  Slowly improving.  Fever curve downtrending.    Subjective: Seen and examined earlier this morning.  No major events overnight of this morning.  Reports some diarrhea overnight.  Breathing has improved.  Looks sleepy this morning.  Says he did not have good sleep last night despite Ambien and melatonin.  Did not tolerate CPAP mask   Objective: Vitals:   03/03/23 2025 03/03/23 2337 03/04/23 0400 03/04/23 0740  BP:  (!) 153/95 (!) 158/86 (!) 158/96  Pulse:  82 76 69  Resp:  18 20 17   Temp: 99.3 F (37.4 C) (!) 100.4 F (38 C) 99.6 F (37.6 C) 98.4 F (36.9 C)  TempSrc: Oral Oral Oral Oral  SpO2:  95% 96% 96%  Weight:      Height:        Examination: GENERAL: No apparent distress.  Nontoxic. HEENT: MMM.  Vision and hearing grossly intact.  NECK: Supple.  No apparent JVD.  RESP:  No IWOB.  Fair aeration bilaterally. CVS:  RRR. Heart sounds normal.  ABD/GI/GU: BS+. Abd soft, NTND.  MSK/EXT:   No apparent deformity. Moves extremities. No edema.  SKIN: no apparent skin lesion or wound NEURO: Sleepy but wakes  to voice.  Oriented appropriately.  No apparent focal neuro deficit. PSYCH: Calm. Normal affect.   Procedures:  None  Microbiology summarized: COVID-19, influenza and RSV PCR nonreactive Full RVP nonreactive  Assessment and plan: Principal Problem:   Severe sepsis (HCC) Active Problems:   AKI (acute kidney injury) (HCC)   Elevated troponin   Hypertension, benign   Hyperlipemia   Anxiety and depression   Multifocal pneumonia   Morbid obesity (HCC)  Severe sepsis due to multifocal pneumonia: POA.  Febrile and tachypneic with AKI POA.  Symptomatic for 5 days.  No leukocytosis.  COVID-19, influenza, RSV and full RVP panel nonreactive.  Blood cultures NGTD.  No oxygen requirement.  Fever curve downtrending.  Clinically improving -Continue CTX and Zithromax -Discontinue IV fluid -Continue incentive telemetry, flutter valve and antitussive  Acute kidney injury: Baseline Cr about 1.0.  AKI resolved. Recent Labs    03/02/23 1241 03/03/23 0828 03/04/23 0016  BUN 16 11 9   CREATININE 1.39* 1.00 0.92  -Discontinue IV fluid   Elevated troponin: Likely demand ischemia.  No significant delta to suggest ACS.  No chest pain either.  EKG without acute ischemic finding.   Hypertension: BP slightly elevated. -Discontinue IV fluid -Continue home losartan   Hyperlipidemia: -Continue simvastatin.   Anxiety/depression/insomnia: -Continue Celexa, melatonin and Ambien.  OSA on CPAP -CPAP at night-not tolerating CPAP mask.  Hypokalemia -Monitor replenish as  appropriate  Thrombocytopenia: Mild.  Acute on chronic. -Continue monitoring  Diarrhea: Due to antibiotics?  Gastroenteritis?  Abdominal exam benign -Start Imodium  Morbid obesity: Elevated BMI with comorbidity as above. Body mass index is 37.17 kg/m. -Encourage lifestyle change to lose weight           DVT prophylaxis:  enoxaparin (LOVENOX) injection 40 mg Start: 03/02/23 1945  Code Status: Full code Family  Communication: None at bedside Level of care: Med-Surg Status is: Inpatient Remains inpatient appropriate because: Severe sepsis due to multifocal pneumonia and fever   Final disposition: Home Consultants:  None  55 minutes with more than 50% spent in reviewing records, counseling patient/family and coordinating care.   Sch Meds:  Scheduled Meds:  azithromycin  500 mg Oral Q24H   citalopram  40 mg Oral Daily   enoxaparin (LOVENOX) injection  40 mg Subcutaneous Q24H   guaiFENesin  600 mg Oral BID   losartan  100 mg Oral QPM   simvastatin  40 mg Oral QPM   sodium chloride flush  3 mL Intravenous Q12H   Continuous Infusions:  cefTRIAXone (ROCEPHIN)  IV 2 g (03/04/23 1057)   PRN Meds:.acetaminophen **OR** acetaminophen, albuterol, loperamide, melatonin, ondansetron **OR** ondansetron (ZOFRAN) IV, senna-docusate, zolpidem  Antimicrobials: Anti-infectives (From admission, onward)    Start     Dose/Rate Route Frequency Ordered Stop   03/03/23 1800  azithromycin (ZITHROMAX) tablet 500 mg        500 mg Oral Every 24 hours 03/03/23 1212 03/07/23 1759   03/03/23 1000  cefTRIAXone (ROCEPHIN) 2 g in sodium chloride 0.9 % 100 mL IVPB        2 g 200 mL/hr over 30 Minutes Intravenous Every 24 hours 03/02/23 1935 03/08/23 0959   03/02/23 1945  azithromycin (ZITHROMAX) 500 mg in sodium chloride 0.9 % 250 mL IVPB  Status:  Discontinued        500 mg 250 mL/hr over 60 Minutes Intravenous Every 24 hours 03/02/23 1935 03/03/23 1212   03/02/23 1845  cefTRIAXone (ROCEPHIN) 2 g in sodium chloride 0.9 % 100 mL IVPB  Status:  Discontinued        2 g 200 mL/hr over 30 Minutes Intravenous  Once 03/02/23 1839 03/02/23 1935   03/02/23 1845  doxycycline (VIBRAMYCIN) 100 mg in sodium chloride 0.9 % 250 mL IVPB  Status:  Discontinued        100 mg 125 mL/hr over 120 Minutes Intravenous  Once 03/02/23 1839 03/02/23 1935   03/02/23 1300  cefTRIAXone (ROCEPHIN) 1 g in sodium chloride 0.9 % 100 mL IVPB         1 g 200 mL/hr over 30 Minutes Intravenous  Once 03/02/23 1256 03/02/23 1554        I have personally reviewed the following labs and images: CBC: Recent Labs  Lab 03/02/23 1241 03/03/23 0828 03/04/23 0016  WBC 7.6 7.5 6.4  NEUTROABS 5.8  --   --   HGB 13.6 13.0 13.8  HCT 41.8 38.3* 39.6  MCV 93.3 92.3 91.0  PLT 131* 113* 96*   BMP &GFR Recent Labs  Lab 03/02/23 1241 03/03/23 0828 03/04/23 0016  NA 134* 134* 138  K 3.7 2.9* 4.3  CL 100 102 105  CO2 25 22 21*  GLUCOSE 113* 156* 109*  BUN 16 11 9   CREATININE 1.39* 1.00 0.92  CALCIUM 8.7* 8.3* 8.8*  MG  --  2.0 2.1  PHOS  --   --  2.8   Estimated Creatinine  Clearance: 122.7 mL/min (by C-G formula based on SCr of 0.92 mg/dL). Liver & Pancreas: Recent Labs  Lab 03/02/23 1241 03/04/23 0016  AST 38  --   ALT 46*  --   ALKPHOS 49  --   BILITOT 0.7  --   PROT 6.7  --   ALBUMIN 3.4* 2.9*   No results for input(s): "LIPASE", "AMYLASE" in the last 168 hours. No results for input(s): "AMMONIA" in the last 168 hours. Diabetic: Recent Labs    03/03/23 0828  HGBA1C 5.3   No results for input(s): "GLUCAP" in the last 168 hours. Cardiac Enzymes: Recent Labs  Lab 03/02/23 1241  CKTOTAL 123   No results for input(s): "PROBNP" in the last 8760 hours. Coagulation Profile: Recent Labs  Lab 03/02/23 1720  INR 1.2   Thyroid Function Tests: No results for input(s): "TSH", "T4TOTAL", "FREET4", "T3FREE", "THYROIDAB" in the last 72 hours. Lipid Profile: No results for input(s): "CHOL", "HDL", "LDLCALC", "TRIG", "CHOLHDL", "LDLDIRECT" in the last 72 hours. Anemia Panel: No results for input(s): "VITAMINB12", "FOLATE", "FERRITIN", "TIBC", "IRON", "RETICCTPCT" in the last 72 hours. Urine analysis:    Component Value Date/Time   COLORURINE YELLOW 03/02/2023 1515   APPEARANCEUR CLEAR 03/02/2023 1515   LABSPEC 1.023 03/02/2023 1515   PHURINE 5.0 03/02/2023 1515   GLUCOSEU NEGATIVE 03/02/2023 1515   HGBUR SMALL  (A) 03/02/2023 1515   BILIRUBINUR NEGATIVE 03/02/2023 1515   KETONESUR NEGATIVE 03/02/2023 1515   PROTEINUR 30 (A) 03/02/2023 1515   NITRITE NEGATIVE 03/02/2023 1515   LEUKOCYTESUR NEGATIVE 03/02/2023 1515   Sepsis Labs: Invalid input(s): "PROCALCITONIN", "LACTICIDVEN"  Microbiology: Recent Results (from the past 240 hour(s))  Resp panel by RT-PCR (RSV, Flu A&B, Covid) Anterior Nasal Swab     Status: None   Collection Time: 03/02/23 12:50 PM   Specimen: Anterior Nasal Swab  Result Value Ref Range Status   SARS Coronavirus 2 by RT PCR NEGATIVE NEGATIVE Final   Influenza A by PCR NEGATIVE NEGATIVE Final   Influenza B by PCR NEGATIVE NEGATIVE Final    Comment: (NOTE) The Xpert Xpress SARS-CoV-2/FLU/RSV plus assay is intended as an aid in the diagnosis of influenza from Nasopharyngeal swab specimens and should not be used as a sole basis for treatment. Nasal washings and aspirates are unacceptable for Xpert Xpress SARS-CoV-2/FLU/RSV testing.  Fact Sheet for Patients: BloggerCourse.com  Fact Sheet for Healthcare Providers: SeriousBroker.it  This test is not yet approved or cleared by the [place] FDA and has been authorized for detection and/or diagnosis of SARS-CoV-2 by FDA under an Emergency Use Authorization (EUA). This EUA will remain in effect (meaning this test can be used) for the duration of the COVID-19 declaration under Section 564(b)(1) of the Act, 21 U.S.C. section 360bbb-3(b)(1), unless the authorization is terminated or revoked.     Resp Syncytial Virus by PCR NEGATIVE NEGATIVE Final    Comment: (NOTE) Fact Sheet for Patients: BloggerCourse.com  Fact Sheet for Healthcare Providers: SeriousBroker.it  This test is not yet approved or cleared by the [place] FDA and has been authorized for detection and/or diagnosis of SARS-CoV-2 by FDA under an  Emergency Use Authorization (EUA). This EUA will remain in effect (meaning this test can be used) for the duration of the COVID-19 declaration under Section 564(b)(1) of the Act, 21 U.S.C. section 360bbb-3(b)(1), unless the authorization is terminated or revoked.  Performed at [facility], [address], [place], [place] [address]   Blood Culture (routine x 2)  Status: None (Preliminary result)   Collection Time: 03/02/23  2:00 PM   Specimen: BLOOD  Result Value Ref Range Status   Specimen Description BLOOD RIGHT ANTECUBITAL  Final   Special Requests   Final    BOTTLES DRAWN AEROBIC AND ANAEROBIC Blood Culture results may not be optimal due to an inadequate volume of blood received in culture bottles   Culture   Final    NO GROWTH 2 DAYS Performed at [facility], [address] [place], [place] [address]    Report Status PENDING  Incomplete  Blood Culture (routine x 2)     Status: None (Preliminary result)   Collection Time: 03/02/23  2:30 PM   Specimen: BLOOD  Result Value Ref Range Status   Specimen Description BLOOD RIGHT ANTECUBITAL  Final   Special Requests   Final    BOTTLES DRAWN AEROBIC AND ANAEROBIC Blood Culture adequate volume   Culture   Final    NO GROWTH 2 DAYS Performed at [facility], [address], [place], [place] [address]    Report Status PENDING  Incomplete  Respiratory (~20 pathogens) panel by PCR     Status: None   Collection Time: 03/02/23  7:23 PM   Specimen: Nasopharyngeal Swab; Respiratory  Result Value Ref Range Status   Adenovirus NOT DETECTED NOT DETECTED Final   Coronavirus 229E NOT DETECTED NOT DETECTED Final    Comment: (NOTE) The Coronavirus on the Respiratory Panel, DOES NOT test for the novel  Coronavirus (2019 nCoV)    Coronavirus HKU1 NOT DETECTED NOT DETECTED Final   Coronavirus NL63 NOT DETECTED NOT DETECTED Final   Coronavirus OC43 NOT DETECTED NOT DETECTED Final   Metapneumovirus NOT DETECTED NOT  DETECTED Final   Rhinovirus / Enterovirus NOT DETECTED NOT DETECTED Final   Influenza A NOT DETECTED NOT DETECTED Final   Influenza B NOT DETECTED NOT DETECTED Final   Parainfluenza Virus 1 NOT DETECTED NOT DETECTED Final   Parainfluenza Virus 2 NOT DETECTED NOT DETECTED Final   Parainfluenza Virus 3 NOT DETECTED NOT DETECTED Final   Parainfluenza Virus 4 NOT DETECTED NOT DETECTED Final   Respiratory Syncytial Virus NOT DETECTED NOT DETECTED Final   Bordetella pertussis NOT DETECTED NOT DETECTED Final   Bordetella Parapertussis NOT DETECTED NOT DETECTED Final   Chlamydophila pneumoniae NOT DETECTED NOT DETECTED Final   Mycoplasma pneumoniae NOT DETECTED NOT DETECTED Final    Comment: Performed at [facility], [address] [place], [place] [address]    Radiology Studies: No results found.    Kadrian Partch T. Goth[facility]  If 7PM-7AM, please contact night-coverage [email or web address] 03/04/2023, 11:38 AM

## 2023-03-04 NOTE — Progress Notes (Signed)
   03/04/23 2316  BiPAP/CPAP/SIPAP  BiPAP/CPAP/SIPAP Pt Type Adult  BiPAP/CPAP/SIPAP DREAMSTATIOND  Mask Type Full face mask  Mask Size Medium  Respiratory Rate 16 breaths/min  FiO2 (%) 21 %  Patient Home Equipment No  Auto Titrate Yes  BiPAP/CPAP /SiPAP Vitals  Pulse Rate 68  Resp 16  SpO2 97 %

## 2023-03-05 DIAGNOSIS — J189 Pneumonia, unspecified organism: Secondary | ICD-10-CM | POA: Diagnosis not present

## 2023-03-05 DIAGNOSIS — A419 Sepsis, unspecified organism: Secondary | ICD-10-CM | POA: Diagnosis not present

## 2023-03-05 DIAGNOSIS — E785 Hyperlipidemia, unspecified: Secondary | ICD-10-CM

## 2023-03-05 DIAGNOSIS — I10 Essential (primary) hypertension: Secondary | ICD-10-CM | POA: Diagnosis not present

## 2023-03-05 LAB — RENAL FUNCTION PANEL
Albumin: 2.9 g/dL — ABNORMAL LOW (ref 3.5–5.0)
Anion gap: 10 (ref 5–15)
BUN: 15 mg/dL (ref 6–20)
CO2: 23 mmol/L (ref 22–32)
Calcium: 9.2 mg/dL (ref 8.9–10.3)
Chloride: 104 mmol/L (ref 98–111)
Creatinine, Ser: 0.82 mg/dL (ref 0.61–1.24)
GFR, Estimated: >60 mL/min (ref >60)
Glucose, Bld: 187 mg/dL — ABNORMAL HIGH (ref 70–99)
Phosphorus: 3.6 mg/dL (ref 2.5–4.6)
Potassium: 3.9 mmol/L (ref 3.5–5.1)
Sodium: 137 mmol/L (ref 135–145)

## 2023-03-05 LAB — CBC
HCT: 40.4 % (ref 39.0–52.0)
Hemoglobin: 13.2 g/dL (ref 13.0–17.0)
MCH: 30.1 pg (ref 26.0–34.0)
MCHC: 32.7 g/dL (ref 30.0–36.0)
MCV: 92.2 fL (ref 80.0–100.0)
Platelets: 158 10*3/uL (ref 150–400)
RBC: 4.38 MIL/uL (ref 4.22–5.81)
RDW: 13.4 % (ref 11.5–15.5)
WBC: 5.6 10*3/uL (ref 4.0–10.5)
nRBC: 0 % (ref 0.0–0.2)

## 2023-03-05 LAB — MAGNESIUM: Magnesium: 2.4 mg/dL (ref 1.7–2.4)

## 2023-03-05 MED ORDER — AMOXICILLIN-POT CLAVULANATE 875-125 MG PO TABS: 1.0000 | ORAL_TABLET | Freq: Two times a day (BID) | ORAL | 0 refills | Status: AC

## 2023-03-05 MED ORDER — GUAIFENESIN-DM 100-10 MG/5ML PO SYRP
5.0000 mL | ORAL_SOLUTION | ORAL | Status: DC | PRN
  Administered 2023-03-05: 5 mL via ORAL
  Filled 2023-03-05: qty 5

## 2023-03-05 NOTE — Progress Notes (Signed)
Nursing Discharge Note  Name: Louis Casey MRN: [id number]  Admit Date: 03/02/2023 Discharge date: 03/05/2023  Louis Casey is to be discharged home per MD order.  AVS completed. Reviewed with patient at bedside. Highlighted copy provided for patient to take home.  Patient able to verbalize understanding of discharge instructions. PIV removed. Patient stable upon discharge.   Discharge Instructions     Diet - low sodium heart healthy   Complete by: As directed    Discharge instructions   Complete by: As directed    It has been a pleasure taking care of you!  You were hospitalized due to pneumonia for which you have been treated with antibiotics.  Your symptoms improved.  We are discharging you more antibiotics to complete treatment course.  It is very important that you complete the whole course.  Follow-up with your primary care doctor in 1 to 2 weeks or sooner if needed.   Take care,   Increase activity slowly   Complete by: As directed        Allergies as of 03/05/2023   No Known Allergies      Medication List     TAKE these medications    acetaminophen 500 MG tablet Commonly known as: TYLENOL Take 1,000 mg by mouth every 6 (six) hours as needed for moderate pain, fever or headache.   amoxicillin-clavulanate 875-125 MG tablet Commonly known as: AUGMENTIN Take 1 tablet by mouth 2 (two) times daily for 3 days.   citalopram 40 MG tablet Commonly known as: CELEXA Take 40 mg by mouth daily.   losartan 100 MG tablet Commonly known as: COZAAR Take 100 mg by mouth every evening.   MELATONIN PO Take 3-6 tablets by mouth at bedtime as needed (sleep).   simvastatin 40 MG tablet Commonly known as: ZOCOR Take 40 mg by mouth every evening.   triamcinolone cream 0.1 % Commonly known as: KENALOG Apply 1 Application topically 2 (two) times daily as needed (rash, irritation).        Discharge Instructions/ Education: Discharge instructions given to patient with  verbalized understanding. Discharge education completed with patient/family including: follow up instructions, medication list, discharge activities, and limitations if indicated. Patient instructed to return to Emergency Department, call 911, or call MD for any changes in condition.  Patient escorted via wheelchair to lobby and discharged home via private automobile.

## 2023-03-05 NOTE — Discharge Summary (Signed)
Physician Discharge Summary  Louis Casey ZOX:[id number] DOB: 01-26-1966 DOA: 03/02/2023  PCP: Adolph Pollack, FNP  Admit date: 03/02/2023 Discharge date: 03/05/2023 Admitted From: Home Disposition: Home Recommendations for Outpatient Follow-up:  Follow up with PCP in 1 week Consider ambulatory referral to pulmonology given history of sarcoidosis Check CMP and CBC at follow-up Please follow up on the following pending results: None  Home Health: Not indicated Equipment/Devices: Not ended  Discharge Condition: Stable CODE STATUS: Full code  Follow-up Information     Adolph Pollack, FNP. Schedule an appointment as soon as possible for a visit in 1 week(s).   Specialty: Family Medicine Contact information: [address] [place] [address] [phone]                 Hospital course 57 year old M with PMH of HTN, HLD, depression, OSA and obesity presenting with fever, chills, myalgia, dry cough and progressive shortness of breath for 5 days and found to have multifocal pneumonia with CTA chest showing bilateral lower lobe and LUL patchy infiltrate, and admitted for severe sepsis due to multifocal pneumonia.  He was febrile 203.2 and tachypneic to upper 20s.  Also AKI with Cr 1.4.  COVID-19, influenza and full RVP nonreactive.  Blood cultures obtained.  Started on IV fluid, CTX and Zithromax, and admitted.   Patient improved.  Fever resolved.  Completed 3 days of Zithromax and ceftriaxone, and discharged on p.o. Augmentin for duration of 3 days to complete treatment course.  Ambulated on room air and maintained appropriate saturation without distress.  See individual problem list below for more.   Problems addressed during this hospitalization Principal Problem:   Severe sepsis (HCC) Active Problems:   AKI (acute kidney injury) (HCC)   Elevated troponin   Hypertension, benign   Hyperlipemia   Depression   Multifocal pneumonia   Morbid obesity  (HCC)   Severe sepsis due to multifocal pneumonia: POA.  Febrile and tachypneic with AKI POA.  Symptomatic for 5 days.  No leukocytosis.  COVID-19, influenza, RSV and full RVP panel nonreactive.  Blood cultures NGTD.  No oxygen requirement.  Fever resolved.  Ambulated on room air and maintained appropriate saturation without distress. -Received CTX and Zithromax for 3 days.  Discharged on p.o. Augmentin for 3 more days.   Acute kidney injury: Baseline Cr about 1.0.  AKI resolved.   Elevated troponin: Mild.  Likely demand ischemia.  No significant delta to suggest ACS.  No chest pain either.  EKG without acute ischemic finding.  History of pulmonary sarcoidosis -Recommend referral to pulmonology   Hypertension: BP slightly elevated. -Continue home meds.   Hyperlipidemia: -Continue simvastatin.   Anxiety/depression/insomnia: -Continue home meds   OSA on CPAP:    Hypokalemia: Resolved   Thrombocytopenia: Resolved   Diarrhea: Likely due to antibiotics.  Seems to have resolved.   Morbid obesity: Body mass index is 37.17 kg/m.            Time spent 35 minutes  Vital signs Vitals:   03/04/23 1756 03/04/23 2018 03/04/23 2316 03/05/23 0819  BP: 127/73 (!) 150/84  (!) 148/86  Pulse: 65 65 68 70  Temp: 98.4 F (36.9 C) 97.7 F (36.5 C)  98.1 F (36.7 C)  Resp: 18 17 16 17   Height:      Weight:      SpO2: 95% 97% 97% 95%  TempSrc: Oral Oral  Oral  BMI (Calculated):         Discharge  exam  GENERAL: No apparent distress.  Nontoxic. HEENT: MMM.  Vision and hearing grossly intact.  NECK: Supple.  No apparent JVD.  RESP:  No IWOB.  Fair aeration bilaterally. CVS:  RRR. Heart sounds normal.  ABD/GI/GU: BS+. Abd soft, NTND.  MSK/EXT:  Moves extremities. No apparent deformity. No edema.  SKIN: no apparent skin lesion or wound NEURO: Awake and alert. Oriented appropriately.  No apparent focal neuro deficit. PSYCH: Calm. Normal affect.   Discharge  Instructions Discharge Instructions     Diet - low sodium heart healthy   Complete by: As directed    Discharge instructions   Complete by: As directed    It has been a pleasure taking care of you!  You were hospitalized due to pneumonia for which you have been treated with antibiotics.  Your symptoms improved.  We are discharging you more antibiotics to complete treatment course.  It is very important that you complete the whole course.  Follow-up with your primary care doctor in 1 to 2 weeks or sooner if needed.   Take care,   Increase activity slowly   Complete by: As directed       Allergies as of 03/05/2023   No Known Allergies      Medication List     TAKE these medications    acetaminophen 500 MG tablet Commonly known as: TYLENOL Take 1,000 mg by mouth every 6 (six) hours as needed for moderate pain, fever or headache.   amoxicillin-clavulanate 875-125 MG tablet Commonly known as: AUGMENTIN Take 1 tablet by mouth 2 (two) times daily for 3 days.   citalopram 40 MG tablet Commonly known as: CELEXA Take 40 mg by mouth daily.   losartan 100 MG tablet Commonly known as: COZAAR Take 100 mg by mouth every evening.   MELATONIN PO Take 3-6 tablets by mouth at bedtime as needed (sleep).   simvastatin 40 MG tablet Commonly known as: ZOCOR Take 40 mg by mouth every evening.   triamcinolone cream 0.1 % Commonly known as: KENALOG Apply 1 Application topically 2 (two) times daily as needed (rash, irritation).        Consultations: None  Procedures/Studies:   CT CHEST ABDOMEN PELVIS W CONTRAST  Result Date: 03/02/2023 CLINICAL DATA:  Sepsis.  Abdominal pain with diarrhea. EXAM: CT CHEST, ABDOMEN, AND PELVIS WITH CONTRAST TECHNIQUE: Multidetector CT imaging of the chest, abdomen and pelvis was performed following the standard protocol during bolus administration of intravenous contrast. RADIATION DOSE REDUCTION: This exam was performed according to the  departmental dose-optimization program which includes automated exposure control, adjustment of the mA and/or kV according to patient size and/or use of iterative reconstruction technique. CONTRAST:  75mL OMNIPAQUE IOHEXOL 350 MG/ML SOLN COMPARISON:  CT of the chest 12/10/2010 FINDINGS: CT CHEST FINDINGS Cardiovascular: No significant vascular findings. Normal heart size. No pericardial effusion. Mediastinum/Nodes: No enlarged mediastinal, hilar, or axillary lymph nodes. Thyroid gland, trachea, and esophagus demonstrate no significant findings. There is a small hiatal hernia. Lungs/Pleura: Multifocal patchy airspace opacities are seen in the bilateral lower lobes and minimally in the left upper lobe. There is no pleural effusion or pneumothorax. Trachea and central airways are patent. Musculoskeletal: No acute fractures are seen. There are healed left second, third and fourth rib fractures. CT ABDOMEN PELVIS FINDINGS Hepatobiliary: No focal liver abnormality is seen. Status post cholecystectomy. No biliary dilatation. Pancreas: Unremarkable. No pancreatic ductal dilatation or surrounding inflammatory changes. Spleen: Normal in size without focal abnormality. Adrenals/Urinary Tract: Adrenal glands are unremarkable.  Kidneys are normal, without renal calculi, focal lesion, or hydronephrosis. Bladder is unremarkable. Stomach/Bowel: Stomach is within normal limits. Appendix appears normal. No evidence of bowel wall thickening, distention, or inflammatory changes. Vascular/Lymphatic: No significant vascular findings are present. No enlarged abdominal or pelvic lymph nodes. Reproductive: Prostate is unremarkable. Other: There is a anterior upper abdominal wall ventral hernia containing mesenteric fat, left of midline, moderate in size. Musculoskeletal: No acute osseous findings. Left-sided hip screw is present. There severe degenerative changes of the left hip. IMPRESSION: 1. Multifocal patchy airspace opacities in the  bilateral lower lobes and minimally in the left upper lobe worrisome for multi focal pneumonia. 2. No acute localizing process in the abdomen or pelvis. 3. Moderate-sized ventral hernia containing mesenteric fat. Electronically Signed   By: Darliss Cheney M.D.   On: 03/02/2023 17:36   DG Chest 2 View  Result Date: 03/02/2023 CLINICAL DATA:  Fever. EXAM: CHEST - 2 VIEW COMPARISON:  None Available. FINDINGS: The heart size and mediastinal contours are within normal limits. Bilateral perihilar opacities are noted concerning for edema or atypical inflammation. The visualized skeletal structures are unremarkable. IMPRESSION: Bilateral perihilar opacities are noted concerning for edema or atypical inflammation. Follow-up radiographs are recommended. Electronically Signed   By: Lupita Raider M.D.   On: 03/02/2023 13:51       The results of significant diagnostics from this hospitalization (including imaging, microbiology, ancillary and laboratory) are listed below for reference.     Microbiology: Recent Results (from the past 240 hour(s))  Resp panel by RT-PCR (RSV, Flu A&B, Covid) Anterior Nasal Swab     Status: None   Collection Time: 03/02/23 12:50 PM   Specimen: Anterior Nasal Swab  Result Value Ref Range Status   SARS Coronavirus 2 by RT PCR NEGATIVE NEGATIVE Final   Influenza A by PCR NEGATIVE NEGATIVE Final   Influenza B by PCR NEGATIVE NEGATIVE Final    Comment: (NOTE) The Xpert Xpress SARS-CoV-2/FLU/RSV plus assay is intended as an aid in the diagnosis of influenza from Nasopharyngeal swab specimens and should not be used as a sole basis for treatment. Nasal washings and aspirates are unacceptable for Xpert Xpress SARS-CoV-2/FLU/RSV testing.  Fact Sheet for Patients: BloggerCourse.com  Fact Sheet for Healthcare Providers: SeriousBroker.it  This test is not yet approved or cleared by the [place] FDA and has been authorized for  detection and/or diagnosis of SARS-CoV-2 by FDA under an Emergency Use Authorization (EUA). This EUA will remain in effect (meaning this test can be used) for the duration of the COVID-19 declaration under Section 564(b)(1) of the Act, 21 U.S.C. section 360bbb-3(b)(1), unless the authorization is terminated or revoked.     Resp Syncytial Virus by PCR NEGATIVE NEGATIVE Final    Comment: (NOTE) Fact Sheet for Patients: BloggerCourse.com  Fact Sheet for Healthcare Providers: SeriousBroker.it  This test is not yet approved or cleared by the [place] FDA and has been authorized for detection and/or diagnosis of SARS-CoV-2 by FDA under an Emergency Use Authorization (EUA). This EUA will remain in effect (meaning this test can be used) for the duration of the COVID-19 declaration under Section 564(b)(1) of the Act, 21 U.S.C. section 360bbb-3(b)(1), unless the authorization is terminated or revoked.  Performed at [facility], [address] [place], [place] [address]   Blood Culture (routine x 2)     Status: None (Preliminary result)   Collection Time: 03/02/23  2:00 PM   Specimen: BLOOD  Result Value Ref Range Status  Specimen Description BLOOD RIGHT ANTECUBITAL  Final   Special Requests   Final    BOTTLES DRAWN AEROBIC AND ANAEROBIC Blood Culture results may not be optimal due to an inadequate volume of blood received in culture bottles   Culture   Final    NO GROWTH 3 DAYS Performed at [facility], [address], [place], [place] [address]    Report Status PENDING  Incomplete  Blood Culture (routine x 2)     Status: None (Preliminary result)   Collection Time: 03/02/23  2:30 PM   Specimen: BLOOD  Result Value Ref Range Status   Specimen Description BLOOD RIGHT ANTECUBITAL  Final   Special Requests   Final    BOTTLES DRAWN AEROBIC AND ANAEROBIC Blood Culture adequate volume   Culture   Final    NO GROWTH 3  DAYS Performed at [facility], [address], [place], [place] [address]    Report Status PENDING  Incomplete  Respiratory (~20 pathogens) panel by PCR     Status: None   Collection Time: 03/02/23  7:23 PM   Specimen: Nasopharyngeal Swab; Respiratory  Result Value Ref Range Status   Adenovirus NOT DETECTED NOT DETECTED Final   Coronavirus 229E NOT DETECTED NOT DETECTED Final    Comment: (NOTE) The Coronavirus on the Respiratory Panel, DOES NOT test for the novel  Coronavirus (2019 nCoV)    Coronavirus HKU1 NOT DETECTED NOT DETECTED Final   Coronavirus NL63 NOT DETECTED NOT DETECTED Final   Coronavirus OC43 NOT DETECTED NOT DETECTED Final   Metapneumovirus NOT DETECTED NOT DETECTED Final   Rhinovirus / Enterovirus NOT DETECTED NOT DETECTED Final   Influenza A NOT DETECTED NOT DETECTED Final   Influenza B NOT DETECTED NOT DETECTED Final   Parainfluenza Virus 1 NOT DETECTED NOT DETECTED Final   Parainfluenza Virus 2 NOT DETECTED NOT DETECTED Final   Parainfluenza Virus 3 NOT DETECTED NOT DETECTED Final   Parainfluenza Virus 4 NOT DETECTED NOT DETECTED Final   Respiratory Syncytial Virus NOT DETECTED NOT DETECTED Final   Bordetella pertussis NOT DETECTED NOT DETECTED Final   Bordetella Parapertussis NOT DETECTED NOT DETECTED Final   Chlamydophila pneumoniae NOT DETECTED NOT DETECTED Final   Mycoplasma pneumoniae NOT DETECTED NOT DETECTED Final    Comment: Performed at [facility], [address] [place], [place] [address]     Labs:  CBC: Recent Labs  Lab 03/02/23 1241 03/03/23 0828 03/04/23 0016 03/05/23 0025  WBC 7.6 7.5 6.4 5.6  NEUTROABS 5.8  --   --   --   HGB 13.6 13.0 13.8 13.2  HCT 41.8 38.3* 39.6 40.4  MCV 93.3 92.3 91.0 92.2  PLT 131* 113* 96* 158   BMP &GFR Recent Labs  Lab 03/02/23 1241 03/03/23 0828 03/04/23 0016 03/05/23 0025  NA 134* 134* 138 137  K 3.7 2.9* 4.3 3.9  CL 100 102 105 104  CO2 25 22 21* 23  GLUCOSE 113* 156* 109* 187*   BUN 16 11 9 15   CREATININE 1.39* 1.00 0.92 0.82  CALCIUM 8.7* 8.3* 8.8* 9.2  MG  --  2.0 2.1 2.4  PHOS  --   --  2.8 3.6   Estimated Creatinine Clearance: 137.6 mL/min (by C-G formula based on SCr of 0.82 mg/dL). Liver & Pancreas: Recent Labs  Lab 03/02/23 1241 03/04/23 0016 03/05/23 0025  AST 38  --   --   ALT 46*  --   --   ALKPHOS  49  --   --   BILITOT 0.7  --   --   PROT 6.7  --   --   ALBUMIN 3.4* 2.9* 2.9*   No results for input(s): "LIPASE", "AMYLASE" in the last 168 hours. No results for input(s): "AMMONIA" in the last 168 hours. Diabetic: Recent Labs    03/03/23 0828  HGBA1C 5.3   No results for input(s): "GLUCAP" in the last 168 hours. Cardiac Enzymes: Recent Labs  Lab 03/02/23 1241  CKTOTAL 123   No results for input(s): "PROBNP" in the last 8760 hours. Coagulation Profile: Recent Labs  Lab 03/02/23 1720  INR 1.2   Thyroid Function Tests: No results for input(s): "TSH", "T4TOTAL", "FREET4", "T3FREE", "THYROIDAB" in the last 72 hours. Lipid Profile: No results for input(s): "CHOL", "HDL", "LDLCALC", "TRIG", "CHOLHDL", "LDLDIRECT" in the last 72 hours. Anemia Panel: No results for input(s): "VITAMINB12", "FOLATE", "FERRITIN", "TIBC", "IRON", "RETICCTPCT" in the last 72 hours. Urine analysis:    Component Value Date/Time   COLORURINE YELLOW 03/02/2023 1515   APPEARANCEUR CLEAR 03/02/2023 1515   LABSPEC 1.023 03/02/2023 1515   PHURINE 5.0 03/02/2023 1515   GLUCOSEU NEGATIVE 03/02/2023 1515   HGBUR SMALL (A) 03/02/2023 1515   BILIRUBINUR NEGATIVE 03/02/2023 1515   KETONESUR NEGATIVE 03/02/2023 1515   PROTEINUR 30 (A) 03/02/2023 1515   NITRITE NEGATIVE 03/02/2023 1515   LEUKOCYTESUR NEGATIVE 03/02/2023 1515   Sepsis Labs: Invalid input(s): "PROCALCITONIN", "LACTICIDVEN"   SIGNED:  Almon Hercules, MD  [facility] 03/05/2023, 3:19 PM

## 2023-03-06 LAB — CULTURE, BLOOD (ROUTINE X 2)
Culture: NO GROWTH
Special Requests: ADEQUATE

## 2023-03-07 LAB — CULTURE, BLOOD (ROUTINE X 2): Culture: NO GROWTH
